# Patient Record
Sex: Female | Born: 1950 | Race: White | Hispanic: No | Marital: Married | Smoking: Former smoker
Health system: Southern US, Community
[De-identification: ages and names within clinical notes are randomized; demographics above are authoritative.]

## PROBLEM LIST (undated history)

## (undated) DIAGNOSIS — M1990 Unspecified osteoarthritis, unspecified site: Secondary | ICD-10-CM

## (undated) DIAGNOSIS — S43439A Superior glenoid labrum lesion of unspecified shoulder, initial encounter: Secondary | ICD-10-CM

## (undated) DIAGNOSIS — Z973 Presence of spectacles and contact lenses: Secondary | ICD-10-CM

## (undated) DIAGNOSIS — Z8709 Personal history of other diseases of the respiratory system: Secondary | ICD-10-CM

## (undated) DIAGNOSIS — J302 Other seasonal allergic rhinitis: Secondary | ICD-10-CM

## (undated) DIAGNOSIS — M75101 Unspecified rotator cuff tear or rupture of right shoulder, not specified as traumatic: Secondary | ICD-10-CM

## (undated) DIAGNOSIS — IMO0001 Reserved for inherently not codable concepts without codable children: Secondary | ICD-10-CM

## (undated) DIAGNOSIS — I10 Essential (primary) hypertension: Secondary | ICD-10-CM

## (undated) DIAGNOSIS — M419 Scoliosis, unspecified: Secondary | ICD-10-CM

## (undated) DIAGNOSIS — K635 Polyp of colon: Secondary | ICD-10-CM

## (undated) DIAGNOSIS — M75100 Unspecified rotator cuff tear or rupture of unspecified shoulder, not specified as traumatic: Secondary | ICD-10-CM

## (undated) DIAGNOSIS — Z8489 Family history of other specified conditions: Secondary | ICD-10-CM

## (undated) DIAGNOSIS — E785 Hyperlipidemia, unspecified: Secondary | ICD-10-CM

## (undated) DIAGNOSIS — M47816 Spondylosis without myelopathy or radiculopathy, lumbar region: Secondary | ICD-10-CM

## (undated) DIAGNOSIS — K219 Gastro-esophageal reflux disease without esophagitis: Secondary | ICD-10-CM

## (undated) HISTORY — PX: TUBAL LIGATION: SHX77

## (undated) HISTORY — PX: OTHER SURGICAL HISTORY: SHX169

## (undated) HISTORY — PX: COLONOSCOPY: SHX174

---

## 1986-07-15 HISTORY — PX: BACK SURGERY: SHX140

## 1998-02-11 ENCOUNTER — Ambulatory Visit (HOSPITAL_COMMUNITY): Admission: RE | Admit: 1998-02-11 | Discharge: 1998-02-11 | Payer: Self-pay | Admitting: *Deleted

## 1998-12-07 ENCOUNTER — Other Ambulatory Visit: Admission: RE | Admit: 1998-12-07 | Discharge: 1998-12-07 | Payer: Self-pay | Admitting: Gynecology

## 2000-01-01 ENCOUNTER — Other Ambulatory Visit: Admission: RE | Admit: 2000-01-01 | Discharge: 2000-01-01 | Payer: Self-pay | Admitting: Gynecology

## 2000-12-23 ENCOUNTER — Other Ambulatory Visit: Admission: RE | Admit: 2000-12-23 | Discharge: 2000-12-23 | Payer: Self-pay | Admitting: Gynecology

## 2002-01-05 ENCOUNTER — Other Ambulatory Visit: Admission: RE | Admit: 2002-01-05 | Discharge: 2002-01-05 | Payer: Self-pay | Admitting: Gynecology

## 2003-01-24 ENCOUNTER — Other Ambulatory Visit: Admission: RE | Admit: 2003-01-24 | Discharge: 2003-01-24 | Payer: Self-pay | Admitting: Gynecology

## 2004-03-29 ENCOUNTER — Other Ambulatory Visit: Admission: RE | Admit: 2004-03-29 | Discharge: 2004-03-29 | Payer: Self-pay | Admitting: Gynecology

## 2005-05-06 ENCOUNTER — Other Ambulatory Visit: Admission: RE | Admit: 2005-05-06 | Discharge: 2005-05-06 | Payer: Self-pay | Admitting: Gynecology

## 2005-12-12 ENCOUNTER — Ambulatory Visit (HOSPITAL_BASED_OUTPATIENT_CLINIC_OR_DEPARTMENT_OTHER): Admission: RE | Admit: 2005-12-12 | Discharge: 2005-12-12 | Payer: Self-pay | Admitting: Orthopedic Surgery

## 2005-12-12 HISTORY — PX: DORSAL COMPARTMENT RELEASE: SHX1474

## 2008-12-26 ENCOUNTER — Encounter: Admission: RE | Admit: 2008-12-26 | Discharge: 2008-12-26 | Payer: Self-pay | Admitting: Family Medicine

## 2010-02-23 ENCOUNTER — Ambulatory Visit (HOSPITAL_COMMUNITY): Admission: RE | Admit: 2010-02-23 | Discharge: 2010-02-23 | Payer: Self-pay | Admitting: Gynecology

## 2010-11-30 NOTE — Op Note (Signed)
NAMEBLAYKE, PINERA               ACCOUNT NO.:  [id number]   MEDICAL RECORD NO.:  [id number]          PATIENT TYPE:  AMB   LOCATION:  DSC                          FACILITY:  [facility]   PHYSICIAN:  Katy Fitch. Sypher, M.D. DATE OF BIRTH:  January 25, 1951   DATE OF PROCEDURE:  12/12/2005  DATE OF DISCHARGE:                                 OPERATIVE REPORT   PREOPERATIVE DIAGNOSIS:  Chronic stenosing tenosynovitis, right first dorsal  compartment.   POSTOPERATIVE DIAGNOSIS:  Chronic stenosing tenosynovitis, right first  dorsal compartment.   OPERATIONS:  Release of right first dorsal compartment.   OPERATING SURGEON:  Josephine Igo, M.D.   ASSISTANT:  Molly Maduro Dasnoit PA-C.   ANESTHESIA:  General by LMA technique.   SUPERVISING ANESTHESIOLOGIST:  Dr. Michelle Piper.   INDICATIONS:  Zadie Deemer is a 60 year old woman referred through the  courtesy of Dr. Catha Gosselin for evaluation and management of a chronically  painful right wrist.  Clinical examination revealed signs of chronic  stenosing tenosynovitis of the right first dorsal compartment.  An x-ray  from Dr. Fredirick Maudlin office was normal.   Due to failure to respond to nonoperative measures Ms. Schnyder is brought to  the operating room at this time for release of her right first dorsal  compartment.   PROCEDURE:  Lashon Beringer was brought to the operating room and placed in  supine position on the operating table.   Following the induction general anesthesia by LMA technique, the right arm  was prepped with Betadine soap solution, sterilely draped.   On exsanguination of right arm with an Esmarch bandage, the arterial  tourniquet was inflated to 220 mmHg.  The procedure commenced with short  transverse incision directly over the palpably thickened first dorsal  compartment.  The subcutaneous tissues were gently divided, retracting the  radial sensory branches.  A Freer used to clear all inflammatory tissues  from the compartment followed  by use of a scalpel and scissors to release  the compartment.  The wall thickness of the compartment had increased to 4  mm.  There were three tendons within the compartment.  No septum was  visualized.  There was a single slip of the extensor pollicis brevis and two  slips of the abductor pollicis longus.   Free range of motion recovered followed by repair of the skin with  intradermal 3-0 Prolene suture and Steri-Strip.   2% lidocaine was infiltrated for postoperative analgesia followed by release  of the tourniquet.  Ms. Oravec was placed in a compressive Ace wrap  dressing.  There no apparent complications.      Katy Fitch Sypher, M.D.  Electronically Signed     RVS/MEDQ  D:  12/12/2005  T:  12/12/2005  Job:  [id number]   cc:   Caryn Bee L. Little, M.D.  Fax: [phone]

## 2011-07-02 ENCOUNTER — Other Ambulatory Visit: Payer: Self-pay | Admitting: Orthopedic Surgery

## 2011-07-18 ENCOUNTER — Encounter (HOSPITAL_BASED_OUTPATIENT_CLINIC_OR_DEPARTMENT_OTHER): Payer: Self-pay | Admitting: *Deleted

## 2011-07-18 ENCOUNTER — Other Ambulatory Visit: Payer: Self-pay | Admitting: Orthopedic Surgery

## 2011-07-18 NOTE — Progress Notes (Signed)
No labs needed Has been here in past

## 2011-07-23 ENCOUNTER — Ambulatory Visit (HOSPITAL_BASED_OUTPATIENT_CLINIC_OR_DEPARTMENT_OTHER)
Admission: RE | Admit: 2011-07-23 | Discharge: 2011-07-23 | Disposition: A | Discharge status: Home Health | Payer: BC Managed Care – PPO | Source: Ambulatory Visit | Attending: Orthopedic Surgery | Admitting: Orthopedic Surgery

## 2011-07-23 ENCOUNTER — Encounter (HOSPITAL_BASED_OUTPATIENT_CLINIC_OR_DEPARTMENT_OTHER): Payer: Self-pay | Admitting: Anesthesiology

## 2011-07-23 ENCOUNTER — Encounter (HOSPITAL_BASED_OUTPATIENT_CLINIC_OR_DEPARTMENT_OTHER)
Admission: RE | Disposition: A | Discharge status: Home Health | Payer: Self-pay | Source: Ambulatory Visit | Attending: Orthopedic Surgery

## 2011-07-23 ENCOUNTER — Encounter (HOSPITAL_BASED_OUTPATIENT_CLINIC_OR_DEPARTMENT_OTHER): Payer: Self-pay | Admitting: Orthopedic Surgery

## 2011-07-23 ENCOUNTER — Ambulatory Visit (HOSPITAL_BASED_OUTPATIENT_CLINIC_OR_DEPARTMENT_OTHER): Payer: BC Managed Care – PPO | Admitting: Anesthesiology

## 2011-07-23 ENCOUNTER — Encounter (HOSPITAL_BASED_OUTPATIENT_CLINIC_OR_DEPARTMENT_OTHER): Payer: Self-pay

## 2011-07-23 DIAGNOSIS — M65849 Other synovitis and tenosynovitis, unspecified hand: Secondary | ICD-10-CM | POA: Insufficient documentation

## 2011-07-23 DIAGNOSIS — M65839 Other synovitis and tenosynovitis, unspecified forearm: Secondary | ICD-10-CM | POA: Insufficient documentation

## 2011-07-23 HISTORY — DX: Other seasonal allergic rhinitis: J30.2

## 2011-07-23 HISTORY — DX: Unspecified osteoarthritis, unspecified site: M19.90

## 2011-07-23 HISTORY — PX: DORSAL COMPARTMENT RELEASE: SHX5039

## 2011-07-23 HISTORY — PX: TRIGGER FINGER RELEASE: SHX641

## 2011-07-23 SURGERY — RELEASE, A1 PULLEY, FOR TRIGGER FINGER
Anesthesia: Choice | Site: Wrist | Laterality: Left | Wound class: Clean

## 2011-07-23 MED ORDER — MIDAZOLAM HCL 2 MG/2ML IJ SOLN: 0.5000 mg | INTRAMUSCULAR | Status: DC | PRN

## 2011-07-23 MED ORDER — ONDANSETRON HCL 4 MG/2ML IJ SOLN
INTRAMUSCULAR | Status: DC | PRN
  Administered 2011-07-23: 4 mg via INTRAVENOUS

## 2011-07-23 MED ORDER — FENTANYL CITRATE 0.05 MG/ML IJ SOLN: 50.0000 ug | INTRAMUSCULAR | Status: DC | PRN

## 2011-07-23 MED ORDER — LIDOCAINE HCL 2 % IJ SOLN
INTRAMUSCULAR | Status: DC | PRN
  Administered 2011-07-23: 3 mL

## 2011-07-23 MED ORDER — METOCLOPRAMIDE HCL 5 MG/ML IJ SOLN: 10.0000 mg | Freq: Once | INTRAMUSCULAR | Status: DC | PRN

## 2011-07-23 MED ORDER — FENTANYL CITRATE 0.05 MG/ML IJ SOLN
INTRAMUSCULAR | Status: DC | PRN
  Administered 2011-07-23: 100 ug via INTRAVENOUS

## 2011-07-23 MED ORDER — LACTATED RINGERS IV SOLN
INTRAVENOUS | Status: DC
  Administered 2011-07-23: 08:00:00 via INTRAVENOUS

## 2011-07-23 MED ORDER — CHLORHEXIDINE GLUCONATE 4 % EX LIQD: 60.0000 mL | Freq: Once | CUTANEOUS | Status: DC

## 2011-07-23 MED ORDER — FENTANYL CITRATE 0.05 MG/ML IJ SOLN
25.0000 ug | INTRAMUSCULAR | Status: DC | PRN
  Administered 2011-07-23 (×2): 50 ug via INTRAVENOUS

## 2011-07-23 MED ORDER — PROPOFOL 10 MG/ML IV EMUL
INTRAVENOUS | Status: DC | PRN
  Administered 2011-07-23: 40 mg via INTRAVENOUS

## 2011-07-23 MED ORDER — MORPHINE SULFATE 2 MG/ML IJ SOLN: 0.0500 mg/kg | INTRAMUSCULAR | Status: DC | PRN

## 2011-07-23 MED ORDER — LIDOCAINE HCL (CARDIAC) 20 MG/ML IV SOLN
INTRAVENOUS | Status: DC | PRN
  Administered 2011-07-23: 60 mg via INTRAVENOUS

## 2011-07-23 MED ORDER — PROPOFOL 10 MG/ML IV EMUL
INTRAVENOUS | Status: DC | PRN
  Administered 2011-07-23: 40 ug/kg/min via INTRAVENOUS

## 2011-07-23 MED ORDER — MIDAZOLAM HCL 5 MG/5ML IJ SOLN
INTRAMUSCULAR | Status: DC | PRN
  Administered 2011-07-23: 2 mg via INTRAVENOUS

## 2011-07-23 MED ORDER — TRAMADOL HCL 50 MG PO TABS: ORAL_TABLET | ORAL | Status: AC

## 2011-07-23 SURGICAL SUPPLY — 46 items
BANDAGE ELASTIC 3 VELCRO ST LF (GAUZE/BANDAGES/DRESSINGS) ×3 IMPLANT
BLADE MINI RND TIP GREEN BEAV (BLADE) IMPLANT
BLADE SURG 15 STRL LF DISP TIS (BLADE) ×2 IMPLANT
BLADE SURG 15 STRL SS (BLADE) ×3
BNDG CMPR 9X4 STRL LF SNTH (GAUZE/BANDAGES/DRESSINGS) ×2
BNDG CMPR MD 5X2 ELC HKLP STRL (GAUZE/BANDAGES/DRESSINGS) ×2
BNDG ELASTIC 2 VLCR STRL LF (GAUZE/BANDAGES/DRESSINGS) ×3 IMPLANT
BNDG ESMARK 4X9 LF (GAUZE/BANDAGES/DRESSINGS) ×1 IMPLANT
BRUSH SCRUB EZ PLAIN DRY (MISCELLANEOUS) ×3 IMPLANT
CLOTH BEACON ORANGE TIMEOUT ST (SAFETY) ×3 IMPLANT
CORDS BIPOLAR (ELECTRODE) ×3 IMPLANT
COVER MAYO STAND STRL (DRAPES) ×3 IMPLANT
COVER TABLE BACK 60X90 (DRAPES) ×3 IMPLANT
CUFF TOURNIQUET SINGLE 18IN (TOURNIQUET CUFF) ×3 IMPLANT
DECANTER SPIKE VIAL GLASS SM (MISCELLANEOUS) IMPLANT
DRAPE EXTREMITY T 121X128X90 (DRAPE) ×3 IMPLANT
DRAPE SURG 17X23 STRL (DRAPES) ×3 IMPLANT
DRSG TEGADERM 4X4.75 (GAUZE/BANDAGES/DRESSINGS) IMPLANT
GAUZE SPONGE 4X4 12PLY STRL LF (GAUZE/BANDAGES/DRESSINGS) ×4 IMPLANT
GAUZE XEROFORM 1X8 LF (GAUZE/BANDAGES/DRESSINGS) ×3 IMPLANT
GLOVE BIOGEL M STRL SZ7.5 (GLOVE) ×3 IMPLANT
GLOVE ORTHO TXT STRL SZ7.5 (GLOVE) ×3 IMPLANT
GOWN PREVENTION PLUS XLARGE (GOWN DISPOSABLE) ×3 IMPLANT
GOWN PREVENTION PLUS XXLARGE (GOWN DISPOSABLE) ×4 IMPLANT
GOWN STRL REIN XL XLG (GOWN DISPOSABLE) ×6 IMPLANT
NEEDLE 27GAX1X1/2 (NEEDLE) ×2 IMPLANT
PACK BASIN DAY SURGERY FS (CUSTOM PROCEDURE TRAY) ×3 IMPLANT
PAD CAST 3X4 CTTN HI CHSV (CAST SUPPLIES) IMPLANT
PAD CAST 4YDX4 CTTN HI CHSV (CAST SUPPLIES) ×2 IMPLANT
PADDING CAST ABS 4INX4YD NS (CAST SUPPLIES) ×1
PADDING CAST ABS COTTON 4X4 ST (CAST SUPPLIES) ×2 IMPLANT
PADDING CAST COTTON 3X4 STRL (CAST SUPPLIES)
PADDING CAST COTTON 4X4 STRL (CAST SUPPLIES) ×3
SLEEVE SCD COMPRESS KNEE MED (MISCELLANEOUS) IMPLANT
SPONGE GAUZE 4X4 12PLY (GAUZE/BANDAGES/DRESSINGS) ×1 IMPLANT
STOCKINETTE 4X48 STRL (DRAPES) ×3 IMPLANT
STRIP CLOSURE SKIN 1/2X4 (GAUZE/BANDAGES/DRESSINGS) ×3 IMPLANT
SUT PROLENE 3 0 PS 2 (SUTURE) ×3 IMPLANT
SUT VIC AB 4-0 P-3 18XBRD (SUTURE) IMPLANT
SUT VIC AB 4-0 P3 18 (SUTURE)
SYR 3ML 23GX1 SAFETY (SYRINGE) IMPLANT
SYR CONTROL 10ML LL (SYRINGE) ×1 IMPLANT
TOWEL OR 17X24 6PK STRL BLUE (TOWEL DISPOSABLE) ×3 IMPLANT
TRAY DSU PREP LF (CUSTOM PROCEDURE TRAY) ×3 IMPLANT
UNDERPAD 30X30 INCONTINENT (UNDERPADS AND DIAPERS) ×3 IMPLANT
WATER STERILE IRR 1000ML POUR (IV SOLUTION) ×3 IMPLANT

## 2011-07-23 NOTE — Anesthesia Postprocedure Evaluation (Signed)
Anesthesia Post Note  Patient: Jean Hudson  Procedure(s) Performed:  RELEASE TRIGGER FINGER/A-1 PULLEY - left thumb; RELEASE DORSAL COMPARTMENT (DEQUERVAIN) - 1st left dorsal compartment  Anesthesia type: General  Patient location: PACU  Post pain: Pain level controlled  Post assessment: Patient's Cardiovascular Status Stable  Last Vitals:  Filed Vitals:   07/23/11 1008  BP: 143/74  Pulse: 61  Temp: 36.4 C  Resp: 16    Post vital signs: Reviewed and stable  Level of consciousness: alert  Complications: No apparent anesthesia complications

## 2011-07-23 NOTE — Brief Op Note (Signed)
07/23/2011  9:11 AM  PATIENT:  Jean Hudson  61 y.o. female  PRE-OPERATIVE DIAGNOSIS:  left 1st dorsal sts, left sts thumb  POST-OPERATIVE DIAGNOSIS:  left 1st dorsal sts, left sts thumb  PROCEDURE:  Procedure(s): RELEASE TRIGGER THUMB /A-1 PULLEY LEFT RELEASE DORSAL COMPARTMENT (DEQUERVAIN) LEFT WRIST  SURGEON:  Surgeon(s): Wyn Forster., MD  PHYSICIAN ASSISTANT:   ASSISTANTS: Mallory Shirk.A-C   -History and Physical Reviewed  -Patient has been re-examined  -No change in the plan of care  Wyn Forster, MD      ANESTHESIA:   local  EBL:  Total I/O In: 300 [I.V.:300] Out: -   BLOOD ADMINISTERED:none  DRAINS: none   LOCAL MEDICATIONS USED:  LIDOCAINE 3 CC 2%  SPECIMEN:  No Specimen  DISPOSITION OF SPECIMEN:  N/A  COUNTS:  YES  TOURNIQUET:  * Missing tourniquet times found for documented tourniquets in log:  15058 *  DICTATION: .Other Dictation: Dictation Number [id number]  PLAN OF CARE: Discharge to home after PACU  PATIENT DISPOSITION:  PACU - hemodynamically stable.

## 2011-07-23 NOTE — Anesthesia Preprocedure Evaluation (Signed)
Anesthesia Evaluation  Patient identified by MRN, date of birth, ID band Patient awake    Reviewed: Allergy & Precautions, H&P , NPO status , Patient's Chart, lab work & pertinent test results, reviewed documented beta blocker date and time   Airway Mallampati: II TM Distance: >3 FB Neck ROM: full    Dental   Pulmonary asthma ,          Cardiovascular neg cardio ROS     Neuro/Psych Negative Neurological ROS  Negative Psych ROS   GI/Hepatic negative GI ROS, Neg liver ROS,   Endo/Other  Negative Endocrine ROS  Renal/GU negative Renal ROS  Genitourinary negative   Musculoskeletal   Abdominal   Peds  Hematology negative hematology ROS (+)   Anesthesia Other Findings See surgeon's H&P   Reproductive/Obstetrics negative OB ROS                           Anesthesia Physical Anesthesia Plan  ASA: II  Anesthesia Plan: MAC   Post-op Pain Management:    Induction:   Airway Management Planned: Simple Face Mask  Additional Equipment:   Intra-op Plan:   Post-operative Plan:   Informed Consent: I have reviewed the patients History and Physical, chart, labs and discussed the procedure including the risks, benefits and alternatives for the proposed anesthesia with the patient or authorized representative who has indicated his/her understanding and acceptance.     Plan Discussed with: CRNA and Surgeon  Anesthesia Plan Comments:         Anesthesia Quick Evaluation

## 2011-07-23 NOTE — Anesthesia Procedure Notes (Signed)
Procedure Name: MAC Performed by: Sharyne Richters Pre-anesthesia Checklist: Patient identified, Suction available and Patient being monitored Oxygen Delivery Method: Simple face mask Placement Confirmation: positive ETCO2

## 2011-07-23 NOTE — Op Note (Signed)
NAMECERITA, RABELO NO.:  [id number]  MEDICAL RECORD NO.:  [id number]  LOCATION:                                 FACILITY:  PHYSICIAN:  Katy Fitch. Jovanny Stephanie, M.D.      DATE OF BIRTH:  DATE OF PROCEDURE:  07/23/2011 DATE OF DISCHARGE:                              OPERATIVE REPORT   PREOPERATIVE DIAGNOSES: 1. Chronic severe stenosing tenosynovitis of left first dorsal     compartment with marked synovitis. 2. 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  POSTOPERATIVE DIAGNOSES: 1. Chronic severe stenosing tenosynovitis of left first dorsal     compartment with marked synovitis. 2. 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 3. Identification of marked synovitis in proximal and within first     dorsal compartment of left wrist.  OPERATION: 1. Release of left first dorsal compartment with incidental     synovectomy and release of septum between abductor pollicis longus     and extensor pollicis brevis tendons distally. 2. Release of A1 pulley of left thumb.  SURGEON:  Katy Fitch. Andrew Blasius, M.D.  ASSISTANT:  Marveen Reeks. Dasnoit, PA-C.  ANESTHESIA:  Lidocaine 2%, field block of left thumb with flexor pollicis longus sheath block and first dorsal compartment block; total volume of 2% lidocaine 3 mL.  SUPERVISED ANESTHESIOLOGIST:  Janetta Hora. Gelene Mink, M.D.  INDICATIONS:  Damaris Abeln is a 61 year old woman referred through the courtesy of Dr. Catha Gosselin for evaluation and management of bilateral trigger thumbs and a painful left first dorsal compartment.  We have tried conservative management with splinting, activity modification, anti-inflammatory medication for several weeks without relief.  She had a large bump at her first dorsal compartment and evidence of chronic synovitis proximal to the compartment.  She had a persistent locking of her left thumb despite steroid injection and other non operative means of  management.  Due to a failure respond to nonoperative measures, she is brought to the operating room at this time for release of her left thumb A1 pulley and release of her left wrist first dorsal compartment.  Preoperatively, she was reminded of potential risks and benefits of surgery.  Questions invited and answered in detail.  DESCRIPTION OF PROCEDURE:  Maudry Zeidan was brought to room 2 of the [facility] and placed in supine position on the operating table.  Following sedation, the left hand and wrist were prepped with Betadine followed by infiltration of 2% lidocaine into the path of intended incision at the first dorsal compartment as well as infiltration into the compartment and infiltration into the flexor pollicis longus tendon sheath and around the anticipated area of incision at the base of the thumb.  The left arm was then prepped with Betadine soap and solution, sterilely draped.  A pneumatic tourniquet was applied to proximal left brachium. Following exsanguination of the left arm with Esmarch bandage, arterial tourniquet was inflated to 220 mmHg.  A routine surgical time-out was accomplished.  Procedure commenced with a short transverse incision directly over the apex of the thickened first dorsal compartment.  Subcutaneous tissues were carefully divided, revealing a very swollen compartment that was invested with inflammatory synovium.  The wall thickness of the compartment was increased to more than 2.5 mm. The radial superficial sensory branches were identified and gently retracted.  The compartment then split longitudinally, revealing three tendon slips, two of the abductor pollicis longus and one of the extensor pollicis brevis.  These were followed distally identifying a small septum that was resected distally.  There was abundant tenosynovitis noted proximally.  Once again assuring that the sensory branches were gently retracted and a synovectomy  was accomplished with a rongeur.  This wound was then repaired with intradermal 3-0 Prolene.  Attention was then directed to the thumb.  A short transverse incision was fashioned directly over the A1 pulley.  Subcutaneous tissues were carefully divided, revealing A1 pulley.  The neurovascular structures were retracted.  The pulley was split with scalpel and scissors.  The tendon was delivered and found to have a large nodule of inflammatory tenosynovitis within the tendon.  This should resolve over time after release of the A1 pulley.  This wound was then repaired with intradermal 3-0 Prolene and a Steri-Strip.  For aftercare, Ms. Leider was placed in compressive dressing with an Ace wrap.  She is provided a prescription for tramadol 50 mg 1 p.o. q.4- 6 hours p.r.n. pain, 20 tablets, without refill.     Katy Fitch Pricila Bridge, M.D.     RVS/MEDQ  D:  07/23/2011  T:  07/23/2011  Job:  [id number]  cc:   Caryn Bee L. Little, M.D.

## 2011-07-23 NOTE — Transfer of Care (Signed)
Immediate Anesthesia Transfer of Care Note  Patient: Jean Hudson  Procedure(s) Performed:  RELEASE TRIGGER FINGER/A-1 PULLEY - left thumb; RELEASE DORSAL COMPARTMENT (DEQUERVAIN) - 1st left dorsal compartment  Patient Location: PACU  Anesthesia Type: MAC  Level of Consciousness: awake  Airway & Oxygen Therapy: Patient Spontanous Breathing  Post-op Assessment: Report given to PACU RN and Post -op Vital signs reviewed and stable  Post vital signs: Reviewed and stable  Complications: No apparent anesthesia complications

## 2011-07-23 NOTE — H&P (Signed)
Jean Hudson is an 61 y.o. female.   Chief Complaint: Complaining of a long history of left first dorsal compartment pain and triggering of the left thumb HPI: Patient is a 61 year old right-hand-dominant female who presented to our office recently complaining of a long term history of pain in the area of the left first dorsal compartment and also symptoms of triggering of the left thumb. No history of antecedent trauma. She tried conservative treatment for this without success. She wishes to proceed with surgical intervention.  Past Medical History  Diagnosis Date  . Asthma   . Seasonal allergies   . Arthritis     Past Surgical History  Procedure Date  . Back surgery 1988    lumb lam  . Colonoscopy     x2  . Dorsal compartment release 2007    rt    History reviewed. No pertinent family history. Social History:  reports that she has never smoked. She does not have any smokeless tobacco history on file. She reports that she drinks alcohol. She reports that she does not use illicit drugs.  Allergies:  Allergies  Allergen Reactions  . Demerol Nausea And Vomiting  . Flagyl (Metronidazole Hcl)     Joint pain    No current facility-administered medications on file as of 07/23/2011.   Medications Prior to Admission  Medication Sig Dispense Refill  . fexofenadine-pseudoephedrine (ALLEGRA-D 24) 180-240 MG per 24 hr tablet Take 1 tablet by mouth daily.        . fluticasone-salmeterol (ADVAIR HFA) 230-21 MCG/ACT inhaler Inhale 2 puffs into the lungs 2 (two) times daily.        Marland Kitchen ibuprofen (ADVIL,MOTRIN) 200 MG tablet Take 200 mg by mouth every 6 (six) hours as needed.        . montelukast (SINGULAIR) 10 MG tablet Take 10 mg by mouth at bedtime.        . Multiple Vitamin (MULTIVITAMIN) capsule Take 1 capsule by mouth daily.        . naproxen sodium (ANAPROX) 220 MG tablet Take 220 mg by mouth 2 (two) times daily as needed.          No results found for this or any previous visit  (from the past 48 hour(s)).  No results found.   Pertinent items are noted in HPI.  Height 5\' 4"  (1.626 m), weight 66.679 kg (147 lb).  General appearance: alert Head: Normocephalic, without obvious abnormality Neck: supple, symmetrical, trachea midline Resp: clear to auscultation bilaterally Cardio: regular rate and rhythm, S1, S2 normal, no murmur, click, rub or gallop GI: normal findings: bowel sounds normal Extremities: Examination of the left hand reveals active triggering of the left thumb at the A1 pulley. She has a positive Finkelstein's on the left. Neurovascular exam was intact. She has full range of motion of her other digits. Pulses: 2+ and symmetric Skin: normal Neurologic: Grossly normal    Assessment/Plan Impression: De Quervain's tenosynovitis of the left first dorsal compartment. Stenosing tenosynovitis of the left thumb.  Plan: Patient to be taken to the operating room to undergo release of left first dorsal compartment and release of A1 pulley of the left thumb. The procedure risks benefits and postoperative course were discussed with the patient at length she was in agreement with this plan.  DASNOIT,Jean Hudson 07/23/2011, 7:29 AM   H&P documentation: 07/23/2011  -History and Physical Reviewed  -Patient has been re-examined  -No change in the plan of care  Wyn Forster, MD

## 2011-07-23 NOTE — Op Note (Signed)
Op note dictated 07/23/11 161096

## 2011-07-24 ENCOUNTER — Encounter (HOSPITAL_BASED_OUTPATIENT_CLINIC_OR_DEPARTMENT_OTHER): Payer: Self-pay | Admitting: Orthopedic Surgery

## 2011-08-16 DIAGNOSIS — S43439A Superior glenoid labrum lesion of unspecified shoulder, initial encounter: Secondary | ICD-10-CM

## 2011-08-16 DIAGNOSIS — M75101 Unspecified rotator cuff tear or rupture of right shoulder, not specified as traumatic: Secondary | ICD-10-CM

## 2011-08-16 HISTORY — DX: Superior glenoid labrum lesion of unspecified shoulder, initial encounter: S43.439A

## 2011-08-16 HISTORY — PX: SHOULDER ARTHROSCOPY: SHX128

## 2011-08-16 HISTORY — DX: Unspecified rotator cuff tear or rupture of right shoulder, not specified as traumatic: M75.101

## 2011-08-28 ENCOUNTER — Other Ambulatory Visit: Payer: Self-pay | Admitting: Orthopedic Surgery

## 2011-09-02 ENCOUNTER — Encounter (HOSPITAL_BASED_OUTPATIENT_CLINIC_OR_DEPARTMENT_OTHER): Payer: Self-pay | Admitting: *Deleted

## 2011-09-05 ENCOUNTER — Encounter (HOSPITAL_BASED_OUTPATIENT_CLINIC_OR_DEPARTMENT_OTHER): Payer: Self-pay | Admitting: Orthopedic Surgery

## 2011-09-05 ENCOUNTER — Ambulatory Visit (HOSPITAL_BASED_OUTPATIENT_CLINIC_OR_DEPARTMENT_OTHER): Payer: BC Managed Care – PPO | Admitting: Anesthesiology

## 2011-09-05 ENCOUNTER — Encounter (HOSPITAL_BASED_OUTPATIENT_CLINIC_OR_DEPARTMENT_OTHER): Payer: Self-pay | Admitting: Anesthesiology

## 2011-09-05 ENCOUNTER — Encounter (HOSPITAL_BASED_OUTPATIENT_CLINIC_OR_DEPARTMENT_OTHER)
Admission: RE | Disposition: A | Discharge status: Home / Self Care | Payer: Self-pay | Source: Ambulatory Visit | Attending: Orthopedic Surgery

## 2011-09-05 ENCOUNTER — Ambulatory Visit (HOSPITAL_BASED_OUTPATIENT_CLINIC_OR_DEPARTMENT_OTHER)
Admission: RE | Admit: 2011-09-05 | Discharge: 2011-09-05 | Disposition: A | Discharge status: Home / Self Care | Payer: BC Managed Care – PPO | Source: Ambulatory Visit | Attending: Orthopedic Surgery | Admitting: Orthopedic Surgery

## 2011-09-05 ENCOUNTER — Encounter (HOSPITAL_BASED_OUTPATIENT_CLINIC_OR_DEPARTMENT_OTHER): Payer: Self-pay | Admitting: *Deleted

## 2011-09-05 DIAGNOSIS — M19019 Primary osteoarthritis, unspecified shoulder: Secondary | ICD-10-CM | POA: Insufficient documentation

## 2011-09-05 DIAGNOSIS — M7500 Adhesive capsulitis of unspecified shoulder: Secondary | ICD-10-CM | POA: Insufficient documentation

## 2011-09-05 DIAGNOSIS — J449 Chronic obstructive pulmonary disease, unspecified: Secondary | ICD-10-CM | POA: Insufficient documentation

## 2011-09-05 DIAGNOSIS — J4489 Other specified chronic obstructive pulmonary disease: Secondary | ICD-10-CM | POA: Insufficient documentation

## 2011-09-05 DIAGNOSIS — Z5333 Arthroscopic surgical procedure converted to open procedure: Secondary | ICD-10-CM | POA: Insufficient documentation

## 2011-09-05 DIAGNOSIS — Z87891 Personal history of nicotine dependence: Secondary | ICD-10-CM | POA: Insufficient documentation

## 2011-09-05 DIAGNOSIS — M24119 Other articular cartilage disorders, unspecified shoulder: Secondary | ICD-10-CM | POA: Insufficient documentation

## 2011-09-05 DIAGNOSIS — M719 Bursopathy, unspecified: Secondary | ICD-10-CM | POA: Insufficient documentation

## 2011-09-05 DIAGNOSIS — M67919 Unspecified disorder of synovium and tendon, unspecified shoulder: Secondary | ICD-10-CM | POA: Insufficient documentation

## 2011-09-05 HISTORY — DX: Unspecified rotator cuff tear or rupture of right shoulder, not specified as traumatic: M75.101

## 2011-09-05 HISTORY — DX: Superior glenoid labrum lesion of unspecified shoulder, initial encounter: S43.439A

## 2011-09-05 LAB — POCT HEMOGLOBIN-HEMACUE: Hemoglobin: 14.3 g/dL (ref 12.0–15.0)

## 2011-09-05 SURGERY — ARTHROSCOPY, SHOULDER WITH REPAIR, ROTATOR CUFF, OPEN
Anesthesia: General | Site: Shoulder | Laterality: Right | Wound class: Clean

## 2011-09-05 MED ORDER — PROPOFOL 10 MG/ML IV EMUL
INTRAVENOUS | Status: DC | PRN
  Administered 2011-09-05: 30 mg via INTRAVENOUS
  Administered 2011-09-05: 200 mg via INTRAVENOUS

## 2011-09-05 MED ORDER — SODIUM CHLORIDE 0.9 % IR SOLN
Status: DC | PRN
  Administered 2011-09-05: 9000 mL

## 2011-09-05 MED ORDER — DROPERIDOL 2.5 MG/ML IJ SOLN
INTRAMUSCULAR | Status: DC | PRN
  Administered 2011-09-05: 0.625 mg via INTRAVENOUS

## 2011-09-05 MED ORDER — SUCCINYLCHOLINE CHLORIDE 20 MG/ML IJ SOLN
INTRAMUSCULAR | Status: DC | PRN
  Administered 2011-09-05: 120 mg via INTRAVENOUS

## 2011-09-05 MED ORDER — EPHEDRINE SULFATE 50 MG/ML IJ SOLN
INTRAMUSCULAR | Status: DC | PRN
  Administered 2011-09-05 (×2): 5 mg via INTRAVENOUS

## 2011-09-05 MED ORDER — BUPIVACAINE-EPINEPHRINE 0.25% -1:200000 IJ SOLN
INTRAMUSCULAR | Status: DC | PRN
  Administered 2011-09-05: 10 mL

## 2011-09-05 MED ORDER — MIDAZOLAM HCL 2 MG/2ML IJ SOLN
1.0000 mg | INTRAMUSCULAR | Status: DC | PRN
  Administered 2011-09-05: 1 mg via INTRAVENOUS

## 2011-09-05 MED ORDER — HYDROMORPHONE HCL 2 MG PO TABS: ORAL_TABLET | ORAL | Status: AC

## 2011-09-05 MED ORDER — DEXAMETHASONE SODIUM PHOSPHATE 4 MG/ML IJ SOLN
INTRAMUSCULAR | Status: DC | PRN
  Administered 2011-09-05: 4 mg via INTRAVENOUS

## 2011-09-05 MED ORDER — CEFAZOLIN SODIUM 1-5 GM-% IV SOLN
INTRAVENOUS | Status: DC | PRN
  Administered 2011-09-05: 1 g via INTRAVENOUS

## 2011-09-05 MED ORDER — FENTANYL CITRATE 0.05 MG/ML IJ SOLN
50.0000 ug | INTRAMUSCULAR | Status: DC | PRN
  Administered 2011-09-05: 50 ug via INTRAVENOUS

## 2011-09-05 MED ORDER — ROPIVACAINE HCL 5 MG/ML IJ SOLN
INTRAMUSCULAR | Status: DC | PRN
  Administered 2011-09-05: 30 mg via EPIDURAL

## 2011-09-05 MED ORDER — CEPHALEXIN 500 MG PO CAPS: 500.0000 mg | ORAL_CAPSULE | Freq: Three times a day (TID) | ORAL | Status: AC

## 2011-09-05 MED ORDER — ONDANSETRON HCL 4 MG/2ML IJ SOLN
INTRAMUSCULAR | Status: DC | PRN
  Administered 2011-09-05: 4 mg via INTRAVENOUS

## 2011-09-05 MED ORDER — CHLORHEXIDINE GLUCONATE 4 % EX LIQD: 60.0000 mL | Freq: Once | CUTANEOUS | Status: DC

## 2011-09-05 MED ORDER — LACTATED RINGERS IV SOLN
INTRAVENOUS | Status: DC
  Administered 2011-09-05 (×2): via INTRAVENOUS

## 2011-09-05 SURGICAL SUPPLY — 85 items
ANCH SUT 2 FT CRKSW 14.7X5.5 (Anchor) ×4 IMPLANT
ANCH SUT SWLK 19.1X5.5 CLS EL (Anchor) ×4 IMPLANT
ANCHOR CORKSCREW FIBER 5.5X15 (Anchor) ×4 IMPLANT
ANCHOR PEEK SWIVEL LOCK 5.5 (Anchor) ×4 IMPLANT
BANDAGE ADHESIVE 1X3 (GAUZE/BANDAGES/DRESSINGS) IMPLANT
BLADE AVERAGE 25X9 (BLADE) IMPLANT
BLADE CUTTER MENIS 5.5 (BLADE) IMPLANT
BLADE SURG 15 STRL LF DISP TIS (BLADE) ×4 IMPLANT
BLADE SURG 15 STRL SS (BLADE) ×6
BUR EGG 3PK/BX (BURR) IMPLANT
BUR OVAL 6.0 (BURR) ×3 IMPLANT
CANISTER OMNI JUG 16 LITER (MISCELLANEOUS) ×3 IMPLANT
CANISTER SUCTION 2500CC (MISCELLANEOUS) ×2 IMPLANT
CANNULA 5.75X7 CRYSTAL CLEAR (CANNULA) IMPLANT
CANNULA SHOULDER 7CM (CANNULA) IMPLANT
CANNULA TWIST IN 8.25X7CM (CANNULA) IMPLANT
CLEANER CAUTERY TIP 5X5 PAD (MISCELLANEOUS) IMPLANT
CLOTH BEACON ORANGE TIMEOUT ST (SAFETY) ×3 IMPLANT
CUTTER MENISCUS  4.2MM (BLADE) ×1
CUTTER MENISCUS 4.2MM (BLADE) ×2 IMPLANT
DECANTER SPIKE VIAL GLASS SM (MISCELLANEOUS) IMPLANT
DRAPE INCISE IOBAN 66X45 STRL (DRAPES) ×3 IMPLANT
DRAPE STERI 35X30 U-POUCH (DRAPES) ×3 IMPLANT
DRAPE SURG 17X23 STRL (DRAPES) ×3 IMPLANT
DRAPE U-SHAPE 47X51 STRL (DRAPES) ×3 IMPLANT
DRAPE U-SHAPE 76X120 STRL (DRAPES) ×6 IMPLANT
DRAPE UTILITY W/TAPE 26X15 (DRAPES) ×3 IMPLANT
DRSG PAD ABDOMINAL 8X10 ST (GAUZE/BANDAGES/DRESSINGS) ×3 IMPLANT
DURAPREP 26ML APPLICATOR (WOUND CARE) ×3 IMPLANT
ELECT REM PT RETURN 9FT ADLT (ELECTROSURGICAL) ×3
ELECTRODE REM PT RTRN 9FT ADLT (ELECTROSURGICAL) ×2 IMPLANT
GLOVE BIO SURGEON STRL SZ 6.5 (GLOVE) ×2 IMPLANT
GLOVE BIOGEL M STRL SZ7.5 (GLOVE) ×3 IMPLANT
GLOVE BIOGEL PI IND STRL 7.0 (GLOVE) ×2 IMPLANT
GLOVE BIOGEL PI IND STRL 8 (GLOVE) ×4 IMPLANT
GLOVE BIOGEL PI INDICATOR 7.0 (GLOVE) ×1
GLOVE BIOGEL PI INDICATOR 8 (GLOVE) ×2
GLOVE ORTHO TXT STRL SZ7.5 (GLOVE) ×3 IMPLANT
GOWN BRE IMP PREV XXLGXLNG (GOWN DISPOSABLE) ×3 IMPLANT
GOWN PREVENTION PLUS XLARGE (GOWN DISPOSABLE) ×3 IMPLANT
GOWN STRL REIN 2XL XLG LVL4 (GOWN DISPOSABLE) ×2 IMPLANT
NDL SCORPION (NEEDLE) ×1 IMPLANT
NDL SUT 6 .5 CRC .975X.05 MAYO (NEEDLE) IMPLANT
NEEDLE MAYO TAPER (NEEDLE)
NEEDLE MINI RC 24MM (NEEDLE) IMPLANT
NEEDLE SCORPION (NEEDLE) ×3 IMPLANT
PACK ARTHROSCOPY DSU (CUSTOM PROCEDURE TRAY) ×3 IMPLANT
PACK BASIN DAY SURGERY FS (CUSTOM PROCEDURE TRAY) ×3 IMPLANT
PAD CLEANER CAUTERY TIP 5X5 (MISCELLANEOUS)
PASSER SUT SWANSON 36MM LOOP (INSTRUMENTS) IMPLANT
PENCIL BUTTON HOLSTER BLD 10FT (ELECTRODE) ×3 IMPLANT
SLEEVE SCD COMPRESS KNEE MED (MISCELLANEOUS) ×3 IMPLANT
SLING ARM FOAM STRAP LRG (SOFTGOODS) ×2 IMPLANT
SLING ARM FOAM STRAP MED (SOFTGOODS) IMPLANT
SPONGE GAUZE 4X4 12PLY (GAUZE/BANDAGES/DRESSINGS) ×3 IMPLANT
SPONGE LAP 4X18 X RAY DECT (DISPOSABLE) ×3 IMPLANT
STRIP CLOSURE SKIN 1/2X4 (GAUZE/BANDAGES/DRESSINGS) ×3 IMPLANT
SUCTION FRAZIER TIP 10 FR DISP (SUCTIONS) IMPLANT
SUT ETHIBOND 2 OS 4 DA (SUTURE) IMPLANT
SUT ETHILON 4 0 PS 2 18 (SUTURE) IMPLANT
SUT FIBERWIRE #2 38 T-5 BLUE (SUTURE)
SUT FIBERWIRE 3-0 18 TAPR NDL (SUTURE)
SUT PROLENE 1 CT (SUTURE) IMPLANT
SUT PROLENE 3 0 PS 2 (SUTURE) ×3 IMPLANT
SUT TIGER TAPE 7 IN WHITE (SUTURE) IMPLANT
SUT VIC AB 0 CT1 27 (SUTURE)
SUT VIC AB 0 CT1 27XBRD ANBCTR (SUTURE) IMPLANT
SUT VIC AB 0 SH 27 (SUTURE) ×2 IMPLANT
SUT VIC AB 2-0 SH 27 (SUTURE) ×3
SUT VIC AB 2-0 SH 27XBRD (SUTURE) ×1 IMPLANT
SUT VIC AB 3-0 SH 27 (SUTURE)
SUT VIC AB 3-0 SH 27X BRD (SUTURE) IMPLANT
SUT VIC AB 3-0 X1 27 (SUTURE) IMPLANT
SUTURE FIBERWR #2 38 T-5 BLUE (SUTURE) IMPLANT
SUTURE FIBERWR 3-0 18 TAPR NDL (SUTURE) IMPLANT
SYR 3ML 23GX1 SAFETY (SYRINGE) IMPLANT
SYR BULB 3OZ (MISCELLANEOUS) IMPLANT
TAPE FIBER 2MM 7IN #2 BLUE (SUTURE) IMPLANT
TAPE PAPER 3X10 WHT MICROPORE (GAUZE/BANDAGES/DRESSINGS) ×3 IMPLANT
TOWEL OR 17X24 6PK STRL BLUE (TOWEL DISPOSABLE) ×3 IMPLANT
TUBE CONNECTING 20X1/4 (TUBING) ×3 IMPLANT
TUBING ARTHROSCOPY IRRIG 16FT (MISCELLANEOUS) IMPLANT
WAND STAR VAC 90 (SURGICAL WAND) ×3 IMPLANT
WATER STERILE IRR 1000ML POUR (IV SOLUTION) ×3 IMPLANT
YANKAUER SUCT BULB TIP NO VENT (SUCTIONS) IMPLANT

## 2011-09-05 NOTE — Anesthesia Preprocedure Evaluation (Signed)
Anesthesia Evaluation  Patient identified by MRN, date of birth, ID band Patient awake    Reviewed: Allergy & Precautions, H&P , NPO status , Patient's Chart, lab work & pertinent test results  History of Anesthesia Complications Negative for: history of anesthetic complications  Airway Mallampati: I TM Distance: >3 FB Neck ROM: Full    Dental  (+) Teeth Intact and Dental Advisory Given   Pulmonary asthma , COPD (well controlled) COPD inhaler, former smoker clear to auscultation  Pulmonary exam normal       Cardiovascular neg cardio ROS Regular Normal    Neuro/Psych    GI/Hepatic negative GI ROS, Neg liver ROS,   Endo/Other  Negative Endocrine ROS  Renal/GU negative Renal ROS     Musculoskeletal   Abdominal   Peds  Hematology negative hematology ROS (+)   Anesthesia Other Findings   Reproductive/Obstetrics                           Anesthesia Physical Anesthesia Plan  ASA: II  Anesthesia Plan: General   Post-op Pain Management:    Induction: Intravenous  Airway Management Planned: Oral ETT  Additional Equipment:   Intra-op Plan:   Post-operative Plan: Extubation in OR  Informed Consent: I have reviewed the patients History and Physical, chart, labs and discussed the procedure including the risks, benefits and alternatives for the proposed anesthesia with the patient or authorized representative who has indicated his/her understanding and acceptance.   Dental advisory given  Plan Discussed with: CRNA and Surgeon  Anesthesia Plan Comments: (Plan routine monitors, GETA  With interscalene block for post op analgesia  )        Anesthesia Quick Evaluation

## 2011-09-05 NOTE — Discharge Instructions (Signed)
[facility] Instructions Hand Surgery  Wound Care: Keep your hand elevated above the level of your heart.  Do not allow it to dangle  by your side.  Keep the dressing dry and do not remove it unless your doctor advises you to do so.  He will usually change it at the time of your post-op visit.  Moving your fingers is advised to stimulate circulation but will depend on the site of your surgery.  If you have a splint applied, your doctor will advise you regarding movement.  Activity: Do not drive or operate machinery today.  Rest today and then you may return to your normal activity and work as indicated by your physician.  Diet:  Drink liquids today or eat a light diet.  You may resume a regular diet tomorrow.    General expectations: Pain for two to three days. Fingers may become slightly swollen.  Call your doctor if any of the following occur: Severe pain not relieved by pain medication. Elevated temperature. Dressing soaked with blood. Inability to move fingers. White or bluish color to fingers.  Regional Anesthesia Blocks  1. Numbness or the inability to move the "blocked" extremity may last from 3-48 hours after placement. The length of time depends on the medication injected and your individual response to the medication. If the numbness is not going away after 48 hours, call your surgeon.  2. The extremity that is blocked will need to be protected until the numbness is gone and the  Strength has returned. Because you cannot feel it, you will need to take extra care to avoid injury. Because it may be weak, you may have difficulty moving it or using it. You may not know what position it is in without looking at it while the block is in effect.  3. For blocks in the legs and feet, returning to weight bearing and walking needs to be done carefully. You will need to wait until the numbness is entirely gone and the strength has returned. You should be able to move your leg and foot  normally before you try and bear weight or walk. You will need someone to be with you when you first try to ensure you do not fall and possibly risk injury.  4. Bruising and tenderness at the needle site are common side effects and will resolve in a few days.  5. Persistent numbness or new problems with movement should be communicated to the surgeon or the [facility] ([phone]).    [facility]  [address] [place], [place] [address] [phone]   Post Anesthesia Home Care Instructions  Activity: Get plenty of rest for the remainder of the day. A responsible adult should stay with you for 24 hours following the procedure.  For the next 24 hours, DO NOT: -Drive a car -Operate machinery -Drink alcoholic beverages -Take any medication unless instructed by your physician -Make any legal decisions or sign important papers.  Meals: Start with liquid foods such as gelatin or soup. Progress to regular foods as tolerated. Avoid greasy, spicy, heavy foods. If nausea and/or vomiting occur, drink only clear liquids until the nausea and/or vomiting subsides. Call your physician if vomiting continues.  Special Instructions/Symptoms: Your throat may feel dry or sore from the anesthesia or the breathing tube placed in your throat during surgery. If this causes discomfort, gargle with warm salt water. The discomfort should disappear within 24 hours.   

## 2011-09-05 NOTE — Progress Notes (Signed)
Assisted Dr. Jackson with right, interscalene  block. Side rails up, monitors on throughout procedure. See vital signs in flow sheet. Tolerated Procedure well. 

## 2011-09-05 NOTE — Op Note (Signed)
OP NOTE DICTATED;  09/05/11 161096
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
## 2011-09-05 NOTE — Transfer of Care (Signed)
Immediate Anesthesia Transfer of Care Note  Patient: Jean Hudson  Procedure(s) Performed: Procedure(s) (LRB): SHOLDER ARTHROSCOPY WITH OPEN ROTATOR CUFF REPAIR (Right)  Patient Location: PACU  Anesthesia Type: General  Level of Consciousness: awake  Airway & Oxygen Therapy: Patient Spontanous Breathing and Patient connected to face mask oxygen  Post-op Assessment: Report given to PACU RN and Post -op Vital signs reviewed and stable  Post vital signs: Reviewed and stable  Complications: No apparent anesthesia complications

## 2011-09-05 NOTE — Anesthesia Postprocedure Evaluation (Signed)
Anesthesia Post Note  Patient: Jean Hudson  Procedure(s) Performed: Procedure(s) (LRB): SHOLDER ARTHROSCOPY WITH OPEN ROTATOR CUFF REPAIR (Right)  Anesthesia type: General  Patient location: PACU  Post pain: Pain level controlled  Post assessment: Patient's Cardiovascular Status Stable  Last Vitals:  Filed Vitals:   09/05/11 1615  BP: 109/51  Pulse: 72  Temp:   Resp: 14    Post vital signs: Reviewed and stable  Level of consciousness: alert  Complications: No apparent anesthesia complications

## 2011-09-05 NOTE — Brief Op Note (Signed)
09/05/2011  3:00 PM  PATIENT:  Jean Hudson  61 y.o. female  PRE-OPERATIVE DIAGNOSIS:  rotator cuff tear, slap lesion right shoulder, ac degenerative arthtitis  POST-OPERATIVE DIAGNOSIS:  rotator cuff tear, slap lesion right shoulder, ac degenerative athritis  PROCEDURE:  SHOULDER ARTHROSCOPY WITH LABRAL DEBRIDEMENT, ROTATOR CUFF DEBRIDEMENT, SUBACROMIAL DEBRIDEMENT / ACROMIOPLASTY AND ARTHROSCOPIC DISTAL CLAVICLE EXCISION  OPEN ROTATOR CUFF REPAIR DOUBLE ROW WITH DIAMONDBACK TECHNIQUE  SURGEON:   Wyn Forster., MD   PHYSICIAN ASSISTANT:   ASSISTANTS: Mallory Shirk.A-C    ANESTHESIA:   GENERAL BY ENDOTRACHEAL TECHNIQUE  EBL:  Total I/O In: 1000 [I.V.:1000] Out: -   BLOOD ADMINISTERED:none  DRAINS: none   LOCAL MEDICATIONS USED:  MARCAINE     SPECIMEN:  No Specimen  DISPOSITION OF SPECIMEN:  N/A  COUNTS:  YES  TOURNIQUET:  * No tourniquets in log *  DICTATION: .Other Dictation: Dictation Number [id number]  PLAN OF CARE: Discharge to home after PACU  PATIENT DISPOSITION:  PACU - hemodynamically stable.

## 2011-09-05 NOTE — Anesthesia Procedure Notes (Addendum)
Anesthesia Regional Block:  Interscalene brachial plexus block  Pre-Anesthetic Checklist: ,, timeout performed, Correct Patient, Correct Site, Correct Laterality, Correct Procedure, Correct Position, site marked, Risks and benefits discussed,  Surgical consent,  Pre-op evaluation,  At surgeon's request and post-op pain management  Laterality: Right  Prep: chloraprep       Needles:  Injection technique: Single-shot  Needle Type: Stimulator Needle - 40      Needle Gauge: 22 and 22 G    Additional Needles:  Procedures: nerve stimulator Interscalene brachial plexus block  Nerve Stimulator or Paresthesia:  Response: forearm twitch, 0.45 mA, 0.1 ms,   Additional Responses:   Narrative:  Start time: 09/05/2011 12:34 PM End time: 09/05/2011 12:41 PM Injection made incrementally with aspirations every 5 mL.  Performed by: Personally  Anesthesiologist: Sandford Craze, MD  Additional Notes: Pt identified in Holding room.  Monitors applied. Working IV access confirmed. Sterile prep.  #22ga PNS to forearm twitch at 0.19mA threshold.  30cc 0.5% Ropivacaine injected incrementally after negative test dose.  Patient asymptomatic, VSS, no heme aspirated, tolerated well.   Sandford Craze, MD  Interscalene brachial plexus block Procedure Name: Intubation Date/Time: 09/05/2011 1:36 PM Performed by: Radford Pax Pre-anesthesia Checklist: Patient identified, Emergency Drugs available, Suction available, Patient being monitored and Timeout performed Patient Re-evaluated:Patient Re-evaluated prior to inductionOxygen Delivery Method: Circle System Utilized Preoxygenation: Pre-oxygenation with 100% oxygen Intubation Type: IV induction Ventilation: Mask ventilation without difficulty Laryngoscope Size: Miller and 3 Grade View: Grade I Tube type: Oral Number of attempts: 1 Airway Equipment and Method: stylet and oral airway Placement Confirmation: ETT inserted through vocal cords under direct vision,   positive ETCO2 and breath sounds checked- equal and bilateral Secured at: 22 cm Tube secured with: Tape (taped on left with pink tape) Dental Injury: Teeth and Oropharynx as per pre-operative assessment

## 2011-09-06 NOTE — Op Note (Signed)
NAME:  HOLLE, SPRICK             ACCOUNT NO.:  [id number]  MEDICAL RECORD NO.:  [id number]  LOCATION:                                 FACILITY:  PHYSICIAN:  Katy Fitch. Davinia Riccardi, M.D.      DATE OF BIRTH:  DATE OF PROCEDURE:  09/05/2011 DATE OF DISCHARGE:                              OPERATIVE REPORT   PREOPERATIVE DIAGNOSIS:  Two-tendon rotator cuff tear, right shoulder with acromioclavicular degenerative arthritis and unfavorable acromial morphology, also preoperative MRI documentation of a type 1 superior labrum anterior and posterior lesion with apparent intact origin of long head of biceps.  POSTOPERATIVE DIAGNOSIS:  Two-tendon rotator cuff tear, right shoulder with acromioclavicular degenerative arthritis and unfavorable acromial morphology, also preoperative MRI documentation of a type 1 superior labrum anterior and posterior lesion with apparent intact origin of long head of biceps with confirmation of acceptable origin of biceps, significant degenerative tear of supraspinatus and infraspinatus tendons and moderate degree of adhesive capsulitis, granulation tissue within right glenohumeral joint.  OPERATION: 1. Diagnostic arthroscopy, right glenohumeral joint followed by     arthroscopic debridement of synovitis, labral degenerative changes     and degenerative rotator cuff. 2. Arthroscopic subacromial decompression with bursectomy,     coracoacromial ligament relaxation, and acromioplasty. 3. Arthroscopic distal clavicle resection. 4. Open double-row repair of supraspinatus and infraspinatus rotator     cuff tendons to decorticate it and profile lowered greater     tuberosity utilizing an Arthrex double diamondback technique.  OPERATING SURGEON:  Katy Fitch. Ezinne Yogi, MD  ASSISTANT:  Marveen Reeks Dasnoit, PA-C  ANESTHESIA:  General by endotracheal technique supplemented by a right ropivacaine, scalene block placed by Dr. Jairo Ben.  INDICATIONS:  Wynell Halberg is a  61 year old woman referred through the courtesy of Dr. Catha Gosselin for evaluation and management of shoulder pain.  Clinical examination revealed weakness of abduction, external rotation, and impingement signs consistent with a stage III impingement or rotator cuff tear.  An MRI of her shoulder was obtained and documented a significant degenerative tear of the supraspinatus.  It was retracted and considerable degenerative change in the infraspinatus.  Her biceps tendon was stable from its origin at the superior glenoid through the rotator interval; however, she did have a shaggy SLAP-type lesion.  There was a suggestion of a possible loose body in her subscapularis recess.  We advised Ms. Mertens to proceed with diagnostic arthroscopy, debridement, subacromial decompression, distal clavicle resection and repair of the rotator cuff.  We discussed the merits of open versus arthroscopic rotator cuff repair.  We have had a very successful track record of repair very small open repairs with precise suture placement. Therefore, I recommended that it would be in her best interest to pursue that strategy.  After informed consent, she was brought to the operating room at this time.  PROCEDURE:  Abuk Selleck was brought to room #6 of the [facility] and placed in supine position on the operating table.  In the preop holding area, she was interviewed by Dr. Jean Rosenthal of Anesthesia.  Dr. Jean Rosenthal recommended an interscalene block.  This was placed with ropivacaine in the holding area without complication.  After approximately 20  minutes, Mrs. Linehan had excellent anesthesia in the entire right upper extremity and forequarter.  In room #6 under Dr. Edison Pace direct supervision, general endotracheal anesthesia was induced.  She was carefully positioned in the beach-chair position with aid of a torso and head holder designed for shoulder arthroscopy followed by routine DuraPrep of  the right arm and forequarter.  Impervious arthroscopy drapes were applied with sterile stockinette on the arm.  Following routine surgical time-out in which we noted her allergies to Demerol, Flagyl, and intolerance to hydrocodone, we subsequently proceeded with instrumentation of the shoulder with an arthroscope through the standard posterior viewing portal with blunt technique beginning with a switching stick placed anteriorly.  Diagnostic arthroscopy confirmed the full-thickness rotator cuff tear.  This was quite substantial with degenerative changes at the insertion of the supraspinatus and infraspinatus.  Subscapularis was intact.  The teres minor was intact.  The hyaline articular cartilage surfaces of the glenoid and humeral head were intact.  The superior labrum from approximately 2 o'clock anteriorly to 10 o'clock posteriorly was quite degenerative and hanging within the joint.  This was subsequently trimmed to a smooth margin with a suction shaver brought into an anterior portal under direct vision.  The biceps was tested with a nerve hook and found to have a stable origin.  The margins of the rotator cuff tear were thoroughly debrided with a 4.2- mm suction shaver followed by documentation of absence of a loose body in the subscapularis recess and inferior recess.  What appeared to be a loose body appeared to be a diverticulum of synovium.  Ms. Probus did have a moderate-degree of inflammatory synovium within the joint and early adhesive capsulitis tissues.  These were thoroughly debrided to normal-appearing tendon.  The scope was then removed from the glenohumeral joint and placed in the subacromial space.  A thorough bursectomy was accomplished followed by use of electrocautery to release a portion of the coracoacromial ligament.  The acromion was leveled to a type 1 morphology with removal of the significant medial and anterior osteophyte.  After  further hemostasis, the [facility] joint was inspected.  The inferior capsule had worn through.  The distal clavicle was denuded of hyaline cartilage.  The distal centimeter of clavicle was removed arthroscopically with a suction bur.  After hemostasis was achieved, we then marked our anticipated incision with a spinal needle and subsequently perform a 2- cm muscle-splitting incision to access the cuff for repair.  Arthrex self-retaining retractor was placed followed by freshening the margin of the repair.  The power bur was used to decorticate and lower the profile of the greater tuberosity.  Two medial 4.75-mm PEEK corkscrew anchors were placed followed by placement of four medial mattress sutures creating an anatomic medial footprint of the rotator cuff followed by double diamondback technique insetting the sutures laterally to two 4.75 swivel locks.  Excellent compression of the rotator cuff to the tuberosity was achieved with a very low profile.  Hemostasis was achieved and further bursectomy accomplished with a rongeur.  The deltoid split was then repaired with mattress suture of 0 Vicryl.  The scope was then replaced in the joint to confirm a satisfactory repair and no falling at the long head of the biceps.  A Veress satisfactory anatomic footprint was recreated with a 14-mm wide footprint on the greater tuberosity anteriorly and approximately an 8-mm footprint posteriorly.  The wound was lavaged with sterile saline followed by repair of the skin with subcutaneous 2-0 Vicryl and intradermal 3-0  Prolene.  A compressive dressing was applied with Xeroflo, sterile gauze, and paper tape.  For aftercare due to inclement weather anticipated, Ms. Pharris will be discharged to the care of her family at home.  We will provide postoperative analgesia by infiltrating Marcaine into the portals and we will see her back for followup in approximately 20 hours for dressing change and discussion of  postoperative exercise program.  I have encouraged her husband to have her ambulate thoroughly at home tonight and it should be noted that she had passive compression devices on her calves throughout the surgery for deep vein thrombosis prevention.  There were no apparent complications.  Estimated blood loss was less than 50 mL.  For aftercare, she was placed in a sling, awakened from general anesthesia, and transferred to the recovery room in stable signs.     Katy Fitch Uvaldo Rybacki, M.D.     RVS/MEDQ  D:  09/05/2011  T:  09/06/2011  Job:  [id number]  cc:   Caryn Bee L. Little, M.D.

## 2012-05-28 ENCOUNTER — Encounter (HOSPITAL_BASED_OUTPATIENT_CLINIC_OR_DEPARTMENT_OTHER): Payer: Self-pay | Admitting: *Deleted

## 2012-05-28 NOTE — Progress Notes (Signed)
This is pt 3rd time here this yr-rt shoulder 2/13-did well-went home Will go ahead and pack overnight bag and bring all meds

## 2012-05-29 ENCOUNTER — Other Ambulatory Visit: Payer: Self-pay | Admitting: Orthopedic Surgery

## 2012-06-01 NOTE — H&P (Signed)
  Jean Hudson is an 61 y.o. female.   Chief Complaint: c/o chronic and progressive pain left shoulder HPI: Jean Hudson returns for follow up evaluation of new symptoms affecting her left shoulder. She has done very well on the right side following rotator cuff reconstruction in February. She has full motion and excellent strength.  The left side has been aching for the past 3 months. She has difficulty sleeping at night on the left side. The pain radiates beneath the deltoid laterally.     Past Medical History  Diagnosis Date  . Asthma   . Seasonal allergies   . Rotator cuff tear, right 08/2011  . SLAP lesion of shoulder 08/2011    right    Past Surgical History  Procedure Date  . Dorsal compartment release 12/12/2005    right 1st dorsal compartment  . Trigger finger release 07/23/2011    Procedure: RELEASE TRIGGER FINGER/A-1 PULLEY;  Surgeon: Wyn Forster., MD;  Location: [facility];  Service: Orthopedics;  Laterality: Left;  left thumb  . Dorsal compartment release 07/23/2011    Procedure: RELEASE DORSAL COMPARTMENT (DEQUERVAIN);  Surgeon: Wyn Forster., MD;  Location: [facility];  Service: Orthopedics;  Laterality: Left;  1st left dorsal compartment  . Back surgery 1988  . Shoulder arthroscopy 2/13    rt rcr  . Tubal ligation   . Colonoscopy     Family History  Problem Relation Age of Onset  . Anesthesia problems Father     very agitated waking up post-op   Social History:  reports that she has never smoked. She has never used smokeless tobacco. She reports that she does not drink alcohol or use illicit drugs.  Allergies:  Allergies  Allergen Reactions  . Demerol Nausea And Vomiting  . Flagyl (Metronidazole Hcl) Swelling and Rash    JOINT SWELLING  . Hydrocodone Itching    No prescriptions prior to admission    No results found for this or any previous visit (from the past 48 hour(s)).  No results found.   Pertinent  items are noted in HPI.  Height 5\' 4"  (1.626 m), weight 66.679 kg (147 lb).  General appearance: alert Head: Normocephalic, without obvious abnormality Neck: supple, symmetrical, trachea midline Resp: clear to auscultation bilaterally Cardio: regular rate and rhythm GI: normal findings: bowel sounds normal Extremities:Exam of her left shoulder reveals pain with resisted abduction and decreased internal and external rotation. The MRI of her left shoulder. This was obtained a [facility] on 05-08-12. Tienna has a rather large substance tear over her supraspinatus rotator cuff tendon and an unfavorable profile of her distal clavicle.  Pulses: 2+ and symmetric Skin: normal Neurologic: Grossly normal    Assessment/Plan Impression:Left shoulder RC tear with AC arthrosis  Plan: To the OR for left SA with SAD/DCR and RC repair.The procedure, risks,benefits and post-op course were discussed with the patient at length and they were in agreement with the plan.   DASNOIT,Clebert Wenger J 06/01/2012, 3:47 PM     H&P documentation: 06/02/2012  -History and Physical Reviewed  -Patient has been re-examined  -No change in the plan of care  Wyn Forster, MD

## 2012-06-02 ENCOUNTER — Encounter (HOSPITAL_BASED_OUTPATIENT_CLINIC_OR_DEPARTMENT_OTHER): Payer: Self-pay | Admitting: Certified Registered Nurse Anesthetist

## 2012-06-02 ENCOUNTER — Encounter (HOSPITAL_BASED_OUTPATIENT_CLINIC_OR_DEPARTMENT_OTHER)
Admission: RE | Disposition: A | Discharge status: Home / Self Care | Payer: Self-pay | Source: Ambulatory Visit | Attending: Orthopedic Surgery

## 2012-06-02 ENCOUNTER — Ambulatory Visit (HOSPITAL_BASED_OUTPATIENT_CLINIC_OR_DEPARTMENT_OTHER): Payer: BC Managed Care – PPO | Admitting: Certified Registered Nurse Anesthetist

## 2012-06-02 ENCOUNTER — Ambulatory Visit (HOSPITAL_BASED_OUTPATIENT_CLINIC_OR_DEPARTMENT_OTHER)
Admission: RE | Admit: 2012-06-02 | Discharge: 2012-06-03 | Disposition: A | Discharge status: Home / Self Care | Payer: BC Managed Care – PPO | Source: Ambulatory Visit | Attending: Orthopedic Surgery | Admitting: Orthopedic Surgery

## 2012-06-02 ENCOUNTER — Encounter (HOSPITAL_BASED_OUTPATIENT_CLINIC_OR_DEPARTMENT_OTHER): Payer: Self-pay | Admitting: *Deleted

## 2012-06-02 DIAGNOSIS — M19019 Primary osteoarthritis, unspecified shoulder: Secondary | ICD-10-CM | POA: Insufficient documentation

## 2012-06-02 DIAGNOSIS — Y658 Other specified misadventures during surgical and medical care: Secondary | ICD-10-CM | POA: Insufficient documentation

## 2012-06-02 DIAGNOSIS — M719 Bursopathy, unspecified: Secondary | ICD-10-CM | POA: Insufficient documentation

## 2012-06-02 DIAGNOSIS — M24119 Other articular cartilage disorders, unspecified shoulder: Secondary | ICD-10-CM | POA: Insufficient documentation

## 2012-06-02 DIAGNOSIS — J45909 Unspecified asthma, uncomplicated: Secondary | ICD-10-CM | POA: Insufficient documentation

## 2012-06-02 DIAGNOSIS — Y9210 Unspecified residential institution as the place of occurrence of the external cause: Secondary | ICD-10-CM | POA: Insufficient documentation

## 2012-06-02 DIAGNOSIS — M67919 Unspecified disorder of synovium and tendon, unspecified shoulder: Secondary | ICD-10-CM | POA: Insufficient documentation

## 2012-06-02 DIAGNOSIS — T84498A Other mechanical complication of other internal orthopedic devices, implants and grafts, initial encounter: Secondary | ICD-10-CM | POA: Insufficient documentation

## 2012-06-02 HISTORY — PX: SHOULDER ARTHROSCOPY WITH ROTATOR CUFF REPAIR AND SUBACROMIAL DECOMPRESSION: SHX5686

## 2012-06-02 LAB — POCT HEMOGLOBIN-HEMACUE: Hemoglobin: 14.7 g/dL (ref 12.0–15.0)

## 2012-06-02 SURGERY — SHOULDER ARTHROSCOPY WITH ROTATOR CUFF REPAIR AND SUBACROMIAL DECOMPRESSION
Anesthesia: General | Site: Shoulder | Laterality: Left | Wound class: Clean

## 2012-06-02 MED ORDER — IBUPROFEN 600 MG PO TABS
600.0000 mg | ORAL_TABLET | Freq: Four times a day (QID) | ORAL | Status: DC | PRN
  Administered 2012-06-02 (×2): 600 mg via ORAL

## 2012-06-02 MED ORDER — HYDROMORPHONE HCL PF 1 MG/ML IJ SOLN
0.5000 mg | INTRAMUSCULAR | Status: DC | PRN
  Administered 2012-06-03: 1 mg via INTRAVENOUS

## 2012-06-02 MED ORDER — CEFAZOLIN SODIUM 1-5 GM-% IV SOLN
1.0000 g | Freq: Four times a day (QID) | INTRAVENOUS | Status: AC
  Administered 2012-06-02 – 2012-06-03 (×3): 1 g via INTRAVENOUS

## 2012-06-02 MED ORDER — CEPHALEXIN 500 MG PO CAPS: 500.0000 mg | ORAL_CAPSULE | Freq: Three times a day (TID) | ORAL | Status: DC

## 2012-06-02 MED ORDER — ONDANSETRON HCL 4 MG/2ML IJ SOLN: 4.0000 mg | Freq: Four times a day (QID) | INTRAMUSCULAR | Status: DC | PRN

## 2012-06-02 MED ORDER — METOCLOPRAMIDE HCL 5 MG PO TABS: 5.0000 mg | ORAL_TABLET | Freq: Three times a day (TID) | ORAL | Status: DC | PRN

## 2012-06-02 MED ORDER — SODIUM CHLORIDE 0.9 % IV SOLN
INTRAVENOUS | Status: DC
  Administered 2012-06-02: 20 mL/h via INTRAVENOUS

## 2012-06-02 MED ORDER — FLUTICASONE PROPIONATE 50 MCG/ACT NA SUSP: 2.0000 | Freq: Every day | NASAL | Status: DC

## 2012-06-02 MED ORDER — DEXAMETHASONE SODIUM PHOSPHATE 4 MG/ML IJ SOLN
INTRAMUSCULAR | Status: DC | PRN
  Administered 2012-06-02: 10 mg via INTRAVENOUS

## 2012-06-02 MED ORDER — FENTANYL CITRATE 0.05 MG/ML IJ SOLN
50.0000 ug | INTRAMUSCULAR | Status: DC | PRN
  Administered 2012-06-02: 100 ug via INTRAVENOUS

## 2012-06-02 MED ORDER — EPHEDRINE SULFATE 50 MG/ML IJ SOLN
INTRAMUSCULAR | Status: DC | PRN
  Administered 2012-06-02 (×2): 10 mg via INTRAVENOUS

## 2012-06-02 MED ORDER — PROPOFOL 10 MG/ML IV BOLUS
INTRAVENOUS | Status: DC | PRN
  Administered 2012-06-02: 150 mg via INTRAVENOUS

## 2012-06-02 MED ORDER — METOCLOPRAMIDE HCL 5 MG/ML IJ SOLN: 5.0000 mg | Freq: Three times a day (TID) | INTRAMUSCULAR | Status: DC | PRN

## 2012-06-02 MED ORDER — LACTATED RINGERS IV SOLN
INTRAVENOUS | Status: DC
  Administered 2012-06-02 (×3): via INTRAVENOUS

## 2012-06-02 MED ORDER — ONDANSETRON HCL 4 MG/2ML IJ SOLN
INTRAMUSCULAR | Status: DC | PRN
  Administered 2012-06-02: 4 mg via INTRAVENOUS

## 2012-06-02 MED ORDER — OXYCODONE HCL 5 MG PO TABS: 5.0000 mg | ORAL_TABLET | Freq: Once | ORAL | Status: AC | PRN

## 2012-06-02 MED ORDER — HYDROMORPHONE HCL 2 MG PO TABS: ORAL_TABLET | ORAL | Status: DC

## 2012-06-02 MED ORDER — OXYCODONE HCL 5 MG/5ML PO SOLN: 5.0000 mg | Freq: Once | ORAL | Status: AC | PRN

## 2012-06-02 MED ORDER — SODIUM CHLORIDE 0.9 % IR SOLN
Status: DC | PRN
  Administered 2012-06-02: 6

## 2012-06-02 MED ORDER — CHLORHEXIDINE GLUCONATE 4 % EX LIQD: 60.0000 mL | Freq: Once | CUTANEOUS | Status: DC

## 2012-06-02 MED ORDER — LIDOCAINE HCL (CARDIAC) 20 MG/ML IV SOLN
INTRAVENOUS | Status: DC | PRN
  Administered 2012-06-02: 30 mg via INTRAVENOUS

## 2012-06-02 MED ORDER — CEFAZOLIN SODIUM-DEXTROSE 2-3 GM-% IV SOLR
2.0000 g | Freq: Once | INTRAVENOUS | Status: AC
  Administered 2012-06-02: 2 g via INTRAVENOUS

## 2012-06-02 MED ORDER — MIDAZOLAM HCL 5 MG/5ML IJ SOLN
INTRAMUSCULAR | Status: DC | PRN
  Administered 2012-06-02: 1 mg via INTRAVENOUS

## 2012-06-02 MED ORDER — MONTELUKAST SODIUM 10 MG PO TABS
10.0000 mg | ORAL_TABLET | Freq: Every day | ORAL | Status: DC
  Administered 2012-06-02: 10 mg via ORAL

## 2012-06-02 MED ORDER — HYDROMORPHONE HCL 4 MG PO TABS
4.0000 mg | ORAL_TABLET | ORAL | Status: DC | PRN
  Administered 2012-06-02 – 2012-06-03 (×3): 2 mg via ORAL

## 2012-06-02 MED ORDER — IBUPROFEN 600 MG PO TABS: 600.0000 mg | ORAL_TABLET | Freq: Four times a day (QID) | ORAL | Status: DC | PRN

## 2012-06-02 MED ORDER — ONDANSETRON HCL 4 MG/2ML IJ SOLN: 4.0000 mg | Freq: Once | INTRAMUSCULAR | Status: AC | PRN

## 2012-06-02 MED ORDER — MEPERIDINE HCL 25 MG/ML IJ SOLN: 6.2500 mg | INTRAMUSCULAR | Status: DC | PRN

## 2012-06-02 MED ORDER — ROPIVACAINE HCL 5 MG/ML IJ SOLN
INTRAMUSCULAR | Status: DC | PRN
  Administered 2012-06-02: 30 mL via EPIDURAL

## 2012-06-02 MED ORDER — MIDAZOLAM HCL 2 MG/2ML IJ SOLN
1.0000 mg | INTRAMUSCULAR | Status: DC | PRN
  Administered 2012-06-02: 2 mg via INTRAVENOUS

## 2012-06-02 MED ORDER — SUCCINYLCHOLINE CHLORIDE 20 MG/ML IJ SOLN
INTRAMUSCULAR | Status: DC | PRN
  Administered 2012-06-02: 100 mg via INTRAVENOUS

## 2012-06-02 MED ORDER — HYDROMORPHONE HCL PF 1 MG/ML IJ SOLN: 0.2500 mg | INTRAMUSCULAR | Status: DC | PRN

## 2012-06-02 SURGICAL SUPPLY — 87 items
ANCH SUT SWLK 19.1 CLS EYLT TL (Anchor) ×2 IMPLANT
ANCH SUT SWLK 19.1 CLS EYLT VT (Anchor) ×1 IMPLANT
ANCH SUT SWLK 19.1X4.75 (Anchor) ×1 IMPLANT
ANCH SUT SWLK 19.1X5.5 CLS (Anchor) ×1 IMPLANT
ANCHOR BIO SWLOCK 4.75 W/TIG (Anchor) ×1 IMPLANT
ANCHOR SUT BIO SW 4.75 W/FIB (Anchor) ×4 IMPLANT
ANCHOR SUT BIO SW 4.75X19.1 (Anchor) ×2 IMPLANT
ANCHOR SWIVELOCK BIO COMP (Anchor) ×1 IMPLANT
BANDAGE ADHESIVE 1X3 (GAUZE/BANDAGES/DRESSINGS) IMPLANT
BLADE AVERAGE 25X9 (BLADE) IMPLANT
BLADE CUTTER MENIS 5.5 (BLADE) IMPLANT
BLADE SURG 15 STRL LF DISP TIS (BLADE) ×2 IMPLANT
BLADE SURG 15 STRL SS (BLADE) ×4
BUR EGG 3PK/BX (BURR) IMPLANT
BUR OVAL 6.0 (BURR) ×2 IMPLANT
CANISTER OMNI JUG 16 LITER (MISCELLANEOUS) ×3 IMPLANT
CANISTER SUCTION 2500CC (MISCELLANEOUS) IMPLANT
CANNULA TWIST IN 8.25X7CM (CANNULA) ×2 IMPLANT
CLEANER CAUTERY TIP 5X5 PAD (MISCELLANEOUS) IMPLANT
CLOTH BEACON ORANGE TIMEOUT ST (SAFETY) ×2 IMPLANT
CUTTER MENISCUS  4.2MM (BLADE) ×1
CUTTER MENISCUS 4.2MM (BLADE) ×1 IMPLANT
DECANTER SPIKE VIAL GLASS SM (MISCELLANEOUS) IMPLANT
DRAPE INCISE IOBAN 66X45 STRL (DRAPES) ×2 IMPLANT
DRAPE STERI 35X30 U-POUCH (DRAPES) ×2 IMPLANT
DRAPE SURG 17X23 STRL (DRAPES) ×2 IMPLANT
DRAPE U-SHAPE 47X51 STRL (DRAPES) ×2 IMPLANT
DRAPE U-SHAPE 76X120 STRL (DRAPES) ×4 IMPLANT
DRSG PAD ABDOMINAL 8X10 ST (GAUZE/BANDAGES/DRESSINGS) ×2 IMPLANT
DURAPREP 26ML APPLICATOR (WOUND CARE) ×2 IMPLANT
ELECT REM PT RETURN 9FT ADLT (ELECTROSURGICAL) ×2
ELECTRODE REM PT RTRN 9FT ADLT (ELECTROSURGICAL) IMPLANT
GLOVE BIO SURGEON STRL SZ 6.5 (GLOVE) ×1 IMPLANT
GLOVE BIOGEL M STRL SZ7.5 (GLOVE) ×4 IMPLANT
GLOVE BIOGEL PI IND STRL 7.0 (GLOVE) ×1 IMPLANT
GLOVE BIOGEL PI IND STRL 8 (GLOVE) ×2 IMPLANT
GLOVE BIOGEL PI INDICATOR 7.0 (GLOVE) ×1
GLOVE BIOGEL PI INDICATOR 8 (GLOVE) ×2
GLOVE EXAM NITRILE EXT CUFF MD (GLOVE) ×1 IMPLANT
GLOVE ORTHO TXT STRL SZ7.5 (GLOVE) ×2 IMPLANT
GOWN BRE IMP PREV XXLGXLNG (GOWN DISPOSABLE) ×2 IMPLANT
GOWN PREVENTION PLUS XLARGE (GOWN DISPOSABLE) ×1 IMPLANT
GOWN STRL REIN 2XL XLG LVL4 (GOWN DISPOSABLE) ×2 IMPLANT
NDL SCORPION (NEEDLE) ×1 IMPLANT
NDL SUT 6 .5 CRC .975X.05 MAYO (NEEDLE) IMPLANT
NEEDLE MAYO TAPER (NEEDLE) ×2
NEEDLE MINI RC 24MM (NEEDLE) IMPLANT
NEEDLE SCORPION (NEEDLE) ×2 IMPLANT
PACK ARTHROSCOPY DSU (CUSTOM PROCEDURE TRAY) ×2 IMPLANT
PACK BASIN DAY SURGERY FS (CUSTOM PROCEDURE TRAY) ×2 IMPLANT
PAD CLEANER CAUTERY TIP 5X5 (MISCELLANEOUS)
PASSER SUT SWANSON 36MM LOOP (INSTRUMENTS) IMPLANT
PENCIL BUTTON HOLSTER BLD 10FT (ELECTRODE) ×1 IMPLANT
SLEEVE SCD COMPRESS KNEE MED (MISCELLANEOUS) ×2 IMPLANT
SLING ARM FOAM STRAP LRG (SOFTGOODS) ×1 IMPLANT
SLING ARM FOAM STRAP MED (SOFTGOODS) IMPLANT
SPONGE GAUZE 4X4 12PLY (GAUZE/BANDAGES/DRESSINGS) ×2 IMPLANT
SPONGE LAP 4X18 X RAY DECT (DISPOSABLE) ×1 IMPLANT
STRIP CLOSURE SKIN 1/2X4 (GAUZE/BANDAGES/DRESSINGS) ×1 IMPLANT
SUCTION FRAZIER TIP 10 FR DISP (SUCTIONS) IMPLANT
SUT ETHIBOND 2 OS 4 DA (SUTURE) IMPLANT
SUT ETHILON 4 0 PS 2 18 (SUTURE) IMPLANT
SUT FIBERWIRE #2 38 T-5 BLUE (SUTURE)
SUT FIBERWIRE 3-0 18 TAPR NDL (SUTURE)
SUT PROLENE 1 CT (SUTURE) ×1 IMPLANT
SUT PROLENE 3 0 PS 2 (SUTURE) ×2 IMPLANT
SUT TIGER TAPE 7 IN WHITE (SUTURE) IMPLANT
SUT VIC AB 0 CT1 27 (SUTURE)
SUT VIC AB 0 CT1 27XBRD ANBCTR (SUTURE) IMPLANT
SUT VIC AB 0 SH 27 (SUTURE) ×1 IMPLANT
SUT VIC AB 2-0 SH 27 (SUTURE) ×2
SUT VIC AB 2-0 SH 27XBRD (SUTURE) IMPLANT
SUT VIC AB 3-0 SH 27 (SUTURE)
SUT VIC AB 3-0 SH 27X BRD (SUTURE) IMPLANT
SUT VIC AB 3-0 X1 27 (SUTURE) IMPLANT
SUTURE FIBERWR #2 38 T-5 BLUE (SUTURE) IMPLANT
SUTURE FIBERWR 3-0 18 TAPR NDL (SUTURE) IMPLANT
SYR 3ML 23GX1 SAFETY (SYRINGE) IMPLANT
SYR BULB 3OZ (MISCELLANEOUS) IMPLANT
TAPE FIBER 2MM 7IN #2 BLUE (SUTURE) ×2 IMPLANT
TAPE PAPER 3X10 WHT MICROPORE (GAUZE/BANDAGES/DRESSINGS) ×2 IMPLANT
TOWEL OR 17X24 6PK STRL BLUE (TOWEL DISPOSABLE) ×2 IMPLANT
TUBE CONNECTING 20X1/4 (TUBING) ×2 IMPLANT
TUBING ARTHROSCOPY IRRIG 16FT (MISCELLANEOUS) ×2 IMPLANT
WAND STAR VAC 90 (SURGICAL WAND) ×2 IMPLANT
WATER STERILE IRR 1000ML POUR (IV SOLUTION) ×2 IMPLANT
YANKAUER SUCT BULB TIP NO VENT (SUCTIONS) IMPLANT

## 2012-06-02 NOTE — Anesthesia Procedure Notes (Addendum)
Anesthesia Regional Block:  Interscalene brachial plexus block  Pre-Anesthetic Checklist: ,, timeout performed, Correct Patient, Correct Site, Correct Laterality, Correct Procedure, Correct Position, site marked, Risks and benefits discussed,  Surgical consent,  Pre-op evaluation,  At surgeon's request and post-op pain management  Laterality: Left  Prep: chloraprep       Needles:  Injection technique: Single-shot  Needle Type: Echogenic Stimulator Needle     Needle Length: 5cm 5 cm     Additional Needles:  Procedures: ultrasound guided (picture in chart) and nerve stimulator Interscalene brachial plexus block  Nerve Stimulator or Paresthesia:  Response: 0.4 mA,   Additional Responses:   Narrative:  Start time: 06/02/2012 7:42 AM End time: 06/02/2012 7:48 AM Injection made incrementally with aspirations every 5 mL.  Performed by: Personally  Anesthesiologist: Arta Bruce MD  Additional Notes: Monitors applied. Patient sedated. Sterile prep and drape,hand hygiene and sterile gloves were used. Relevant anatomy identified.Needle position confirmed.Local anesthetic injected incrementally after negative aspiration. Local anesthetic spread visualized around nerve(s). Vascular puncture avoided. No complications. Image printed for medical record.The patient tolerated the procedure well.       Interscalene brachial plexus block Procedure Name: Intubation Date/Time: 06/02/2012 8:41 AM Performed by: Taysha Majewski D Pre-anesthesia Checklist: Patient identified, Emergency Drugs available, Suction available and Patient being monitored Patient Re-evaluated:Patient Re-evaluated prior to inductionOxygen Delivery Method: Circle System Utilized Preoxygenation: Pre-oxygenation with 100% oxygen Intubation Type: IV induction Ventilation: Mask ventilation without difficulty Laryngoscope Size: Mac and 3 Grade View: Grade I Tube type: Oral Tube size: 7.0 mm Number of attempts:  1 Airway Equipment and Method: stylet,  oral airway and LTA kit utilized Placement Confirmation: ETT inserted through vocal cords under direct vision,  positive ETCO2 and breath sounds checked- equal and bilateral Secured at: 22 cm Tube secured with: Tape Dental Injury: Teeth and Oropharynx as per pre-operative assessment

## 2012-06-02 NOTE — Progress Notes (Signed)
AssistedDr. Ossey with left, ultrasound guided, interscalene  block. Side rails up, monitors on throughout procedure. See vital signs in flow sheet. Tolerated Procedure well.  

## 2012-06-02 NOTE — Anesthesia Postprocedure Evaluation (Signed)
Anesthesia Post Note  Patient: Jean Hudson  Procedure(s) Performed: Procedure(s) (LRB): SHOULDER ARTHROSCOPY WITH ROTATOR CUFF REPAIR AND SUBACROMIAL DECOMPRESSION (Left)  Anesthesia type: general  Patient location: PACU  Post pain: Pain level controlled  Post assessment: Patient's Cardiovascular Status Stable  Last Vitals:  Filed Vitals:   06/02/12 1200  BP: 141/62  Pulse: 80  Temp:   Resp: 13    Post vital signs: Reviewed and stable  Level of consciousness: sedated  Complications: No apparent anesthesia complications

## 2012-06-02 NOTE — Op Note (Signed)
[id number] 

## 2012-06-02 NOTE — Brief Op Note (Signed)
06/02/2012  10:40 AM  PATIENT:  Jean Hudson  61 y.o. female  PRE-OPERATIVE DIAGNOSIS:  LEFT ROTATOR CUFF TEAR, ACROMIOCLAVICULAR JOINT DEGENERATIVE JOINT DISEASE  POST-OPERATIVE DIAGNOSIS:  LEFT ROTATOR CUFF TEAR, ACROMIOCLAVICULAR JOINT DEGENERATIVE JOINT DISEASE  PROCEDURE:  Procedure(s) (LRB) with comments: SHOULDER ARTHROSCOPY WITH ROTATOR CUFF REPAIR AND SUBACROMIAL DECOMPRESSION (Left) - LEFT SHOULDER ARTHROSCOPY WITH SUBACROMIAL DECOMPRESSION, DISTAL CLAVICLE RESECTION AND REPAIR ROTATOR CUFF  SURGEON:  Surgeon(s) and Role:    * Wyn Forster., MD - Primary  PHYSICIAN ASSISTANT:   ASSISTANTS:Thana Ramp Dasnoit,P.A-C  ANESTHESIA:   general  EBL:  Total I/O In: 1700 [I.V.:1700] Out: -   BLOOD ADMINISTERED:none  DRAINS: none   LOCAL MEDICATIONS USED:  LIDOCAINE   SPECIMEN:  No Specimen  DISPOSITION OF SPECIMEN:  N/A  COUNTS:  YES  TOURNIQUET:  * No tourniquets in log *  DICTATION: .Other Dictation: Dictation Number [id number]  PLAN OF CARE: Admit for overnight observation  PATIENT DISPOSITION:  PACU - hemodynamically stable.

## 2012-06-02 NOTE — Transfer of Care (Signed)
Immediate Anesthesia Transfer of Care Note  Patient: Jean Hudson  Procedure(s) Performed: Procedure(s) (LRB) with comments: SHOULDER ARTHROSCOPY WITH ROTATOR CUFF REPAIR AND SUBACROMIAL DECOMPRESSION (Left) - LEFT SHOULDER ARTHROSCOPY WITH SUBACROMIAL DECOMPRESSION, DISTAL CLAVICLE RESECTION AND REPAIR ROTATOR CUFF  Patient Location: PACU  Anesthesia Type:GA combined with regional for post-op pain  Level of Consciousness: awake and alert   Airway & Oxygen Therapy: Patient Spontanous Breathing and Patient connected to face mask oxygen  Post-op Assessment: Report given to PACU RN and Post -op Vital signs reviewed and stable  Post vital signs: Reviewed and stable  Complications: No apparent anesthesia complications

## 2012-06-02 NOTE — Anesthesia Preprocedure Evaluation (Signed)
Anesthesia Evaluation  Patient identified by MRN, date of birth, ID band Patient awake    Reviewed: Allergy & Precautions, H&P , NPO status , Patient's Chart, lab work & pertinent test results  Airway Mallampati: I TM Distance: >3 FB Neck ROM: Full    Dental   Pulmonary asthma ,          Cardiovascular     Neuro/Psych    GI/Hepatic   Endo/Other    Renal/GU      Musculoskeletal   Abdominal   Peds  Hematology   Anesthesia Other Findings   Reproductive/Obstetrics                           Anesthesia Physical Anesthesia Plan  ASA: II  Anesthesia Plan: General   Post-op Pain Management:    Induction: Intravenous  Airway Management Planned: Oral ETT  Additional Equipment:   Intra-op Plan:   Post-operative Plan: Extubation in OR  Informed Consent: I have reviewed the patients History and Physical, chart, labs and discussed the procedure including the risks, benefits and alternatives for the proposed anesthesia with the patient or authorized representative who has indicated his/her understanding and acceptance.     Plan Discussed with: CRNA and Surgeon  Anesthesia Plan Comments:         Anesthesia Quick Evaluation  

## 2012-06-03 NOTE — Op Note (Signed)
NAMESHERRINE, SALBERG               ACCOUNT NO.:  [id number]  MEDICAL RECORD NO.:  [id number]  LOCATION:                                 FACILITY:  PHYSICIAN:  Katy Fitch. Ashely Goosby, M.D. DATE OF BIRTH:  13-Feb-1951  DATE OF PROCEDURE:  06/02/2012 DATE OF DISCHARGE:                              OPERATIVE REPORT   PREOPERATIVE DIAGNOSIS:  MRI documented significant left shoulder rotator cuff PASTA tear of supraspinatus, portion of infraspinatus, also very unfavorable acromioclavicular anatomy due to degenerative arthritis and prominent inferior distal clavicle and anterior medial acromion.  POSTOPERATIVE DIAGNOSIS:  Moderately severe labral degenerative tear involving superior and anterior labrum.  OPERATIONS: 1. Diagnostic arthroscopy of left glenohumeral joint. 2. Arthroscopic debridement of labrum and 90% PASTA tear of     supraspinatus with retraction of supraspinatus deep fibers. 3. Subacromial decompression with bursectomy, coracoacromial ligament     release and acromioplasty. 4. Arthroscopic distal clavicle resection. 5. Arthroscopic reconstruction of PASTA tear of supraspinatus and     portion of infraspinatus utilizing double diamondback technique     with mattress sutures x2. 6. A small incision was by necessity created posteriorly to recover a     fractured biocomposite anchor.  OPERATING SURGEON:  Katy Fitch. Jaeliana Lococo, MD  ASSISTANT:  Marveen Reeks Dasnoit, PA  ANESTHESIA:  General tracheal supplemented by a left interscalene block placed with ultrasound, controlled by Kaylyn Layer. Michelle Piper, MD  INDICATIONS:  Virtie Bungert is a 61 year old patient, well acquainted with our practice.  She is status post reconstruction of her right rotator cuff earlier in 2013.  She returned with similar pain on the left side and requested clinical examination and MRI to determine whether or not she had a rotator cuff tear on the left side.  An MRI obtained, did indeed reveal a 90% plus PASTA tear  of the supraspinatus with retraction of the deep fibers.  She had very unfavorable medial acromial and distal clavicle anatomy at the [facility] joint which was quite degenerative.  Ms. Corriher requested that her surgery be accomplished in November 2013, for multiple reasons including her work schedule.  She understands that I will be on sabbatical during the first quarter of 2014.  She completely understands the surgery and aftercare experience and is willing to proceed at this time, understanding that her aftercare will be under the supervision of my partners during weeks 8 through 12.  Questions were invited and answered in detail preoperatively.  DESCRIPTION OF PROCEDURE:  Sway Guttierrez was interviewed in the holding area and her left shoulder marked for proper surgical site identification protocol.  Dr. Michelle Piper provided detailed anesthesia informed consent and after informed consent, placed a left ropivacaine plexus block without complication utilizing ultrasound guidance.  Satisfactory anesthesia in left shoulder and forequarter was obtained.  Ms. Overall was transferred to room 2 of the [facility], where under Dr. Deirdre Priest direct supervision, general endotracheal anesthesia was induced followed by careful position in the beach-chair position with aid of a torso and head holder designed for shoulder arthroscopy.  The entire left upper extremity and forequarter were prepped with DuraPrep and draped with impervious arthroscopy drapes.  A 2 g of Ancef was  administered as an IV prophylactic antibiotic per weight protocol.  Following a routine surgical time-out, procedure commenced with placing the arthroscope through a standard posterior viewing portal using anterior switching stick technique.  Diagnostic arthroscopy revealed a very degenerative superior and anterior labrum.  The biceps origin was stable.  The biceps tendon was normal through the rotator interval.  The hyaline  articular cartilage surfaces of the glenoid and humeral head were intact.  There was a mild degree of granulation tissue on the deep surface of the rotator cuff and a very large PASTA degenerative tear. After using an anterior portal suction shaver to debride the PASTA lesion, the labrum, and the granulation tissue, we documented the PASTA tear with digital camera.  We then decorticated the greater tuberosity with the suction shaver to bleeding bone, and carefully examined the inferior recess and found no other pathology.  We determined this tear was amenable to an arthroscopic trans-tendon technique.  We subsequently entered the bursa and with the aid of a suction shaver, performed bursectomy, coracoacromial ligament release, hemostasis, and leveling the acromion to a type 1 morphology.  The distal clavicle was very prominent.  The capsule was taken down the cutting cautery and the distal centimeter of clavicle removed arthroscopically.  Hemostasis was achieved with bipolar cautery followed by careful bursectomy to allow visualization of the greater tuberosity and lateral gutter.  We then proceeded to place the scope back in the glenohumeral joint and with trans-tendon technique, placed an anterior mattress suture just behind the biceps tendon at the junction of the hyaline cartilage in the greater tuberosity.  We subsequently used bird beak penetrators to pass the fiber wire suture through an 8-mm wide mattress to create an excellent medial footprint.  We then placed a posterior anchor with trans-tendon technique at the margin of the supraspinatus, infraspinatus.  This was somewhat challenging to see due to lack of ability to retract the tendon.  We ultimately placed a nerve hook within the joint from the anterior portal and allowed visualization as we punched and placed our anchor.  The anchor was driven home until there was no resistance.  We then checked and found the superior  portion of the anchor due to Mrs. Edward's very dense bone had fractured and the anchor was still proud by at least 3 mm.  We attempted to thread the anchor with the driver to drive the anchor deeper, however, due to the fracture of the anchor, this was technically not feasible and rather than have the risk of anchor fragments within the joint.  I elected to retrieve the anchor.  We enlarged the anterior, superior, lateral portal to allow visualization through the bursa to the cuff.  I followed the posterior sutures to the anchor with a tendon splitting technique, retracting with a mini-gelpi retractor.  We were able to simply remove the anchor by unwinding the anchor and applying traction.  We confirmed that all components were removed including the tip, the entire anchor, and a small fragment of hyaline cartilage at the site of the puncture point. We then re-punched the hole deeper, placed a fresh 4.5 and 5 mm bio- corkscrew anchor and countersunk it well into the greater tuberosity and performed a placement of mattress suture of both the fiber tape and FiberWire with a Mayo needle, and then performed a double diamondback repair per Arthrex with 2 medial mattress sutures, creating anatomic footprint.  Great care was taken to recover the deep fibers with the arthroscopic bird beak penetrator  and restore than to an anatomic footprint.  After the repair was completed with 2 empty lateral swivel locks, we placed the scope in the glenohumeral joint and confirmed recreation of an anatomic footprint of the supraspinatus.  The subacromial space was thoroughly lavaged with sterile saline as was the glenohumeral joint.  We then repaired the deltoid split with mattress suture of 0 Vicryl followed by repair of the skin with subcutaneous 0 and 2-0 Vicryl, it was repaired with intradermal 3-0 Prolene.  Compressive dressing was applied with sterile gauze and paper tape followed by a sling.  Ms.  Cottone was transferred to the recovery room in stable signs.  There were no apparent complications.     Katy Fitch Ashok Sawaya, M.D.     RVS/MEDQ  D:  06/02/2012  T:  06/03/2012  Job:  [id number]  cc:   Caryn Bee L. Little, M.D.

## 2012-06-04 ENCOUNTER — Encounter (HOSPITAL_BASED_OUTPATIENT_CLINIC_OR_DEPARTMENT_OTHER): Payer: Self-pay | Admitting: Orthopedic Surgery

## 2014-03-31 ENCOUNTER — Ambulatory Visit: Payer: BC Managed Care – PPO | Admitting: Interventional Cardiology

## 2014-04-19 ENCOUNTER — Ambulatory Visit (INDEPENDENT_AMBULATORY_CARE_PROVIDER_SITE_OTHER): Payer: BC Managed Care – PPO | Admitting: Interventional Cardiology

## 2014-04-19 ENCOUNTER — Encounter: Payer: Self-pay | Admitting: Interventional Cardiology

## 2014-04-19 VITALS — BP 140/100 | HR 65 | Ht 65.0 in | Wt 150.4 lb

## 2014-04-19 DIAGNOSIS — Z13220 Encounter for screening for lipoid disorders: Secondary | ICD-10-CM

## 2014-04-19 DIAGNOSIS — M25519 Pain in unspecified shoulder: Secondary | ICD-10-CM

## 2014-04-19 DIAGNOSIS — Z8249 Family history of ischemic heart disease and other diseases of the circulatory system: Secondary | ICD-10-CM

## 2014-04-19 NOTE — Patient Instructions (Addendum)
Your physician recommends that you return for a FASTING lipid and Cmet on the day of GXT.  Your physician has requested that you have an exercise tolerance test. For further information please visit [email or web address]. Please also follow instruction sheet, as given.

## 2014-04-19 NOTE — Progress Notes (Signed)
Patient ID: Jean Hudson, female   DOB: 05/10/1951, 63 y.o.   MRN: [id number]     Patient ID: TY OSHIMA MRN: [id number] DOB/AGE: 1951/04/29 63 y.o.   Referring Physician Dr. Hulan Fess   Reason for Consultation  RF for CAD  HPI: 63 y/o who has a family h/o CAD, mother with MI at age 93-first cousin who had CAD at 32.  She has had chronic shoulder pain.  SHe has had bilateral shoulder surgeries.  She has chronic shoulder pain on the left-similar to what she had with a rotator cuff tear.  Cleaning house is her most strenuous activity and yard work as well.  She climbs ladders as well 1-2 days/week.   Exertion without picking anything up does not hurt shoulders.   She also has chronic back issues form a previously herniated disc.     Current Outpatient Prescriptions  Medication Sig Dispense Refill  . fluticasone-salmeterol (ADVAIR HFA) 230-21 MCG/ACT inhaler Inhale 2 puffs into the lungs daily.       . meloxicam (MOBIC) 15 MG tablet Take 15 mg by mouth daily.      . montelukast (SINGULAIR) 10 MG tablet Take 10 mg by mouth at bedtime.        No current facility-administered medications for this visit.   Past Medical History  Diagnosis Date  . Asthma   . Seasonal allergies   . Rotator cuff tear, right 08/2011  . SLAP lesion of shoulder 08/2011    right    Family History  Problem Relation Age of Onset  . Anesthesia problems Father     very agitated waking up post-op    History   Social History  . Marital Status: Married    Spouse Name: N/A    Number of Children: N/A  . Years of Education: N/A   Occupational History  . Not on file.   Social History Main Topics  . Smoking status: Never Smoker   . Smokeless tobacco: Never Used  . Alcohol Use: No  . Drug Use: No  . Sexual Activity:    Other Topics Concern  . Not on file   Social History Narrative  . No narrative on file    Past Surgical History  Procedure Laterality Date  . Dorsal compartment release   12/12/2005    right 1st dorsal compartment  . Trigger finger release  07/23/2011    Procedure: RELEASE TRIGGER FINGER/A-1 PULLEY;  Surgeon: Cammie Sickle., MD;  Location: [facility];  Service: Orthopedics;  Laterality: Left;  left thumb  . Dorsal compartment release  07/23/2011    Procedure: RELEASE DORSAL COMPARTMENT (DEQUERVAIN);  Surgeon: Cammie Sickle., MD;  Location: [facility];  Service: Orthopedics;  Laterality: Left;  1st left dorsal compartment  . Back surgery  1988  . Shoulder arthroscopy  2/13    rt rcr  . Tubal ligation    . Colonoscopy    . Shoulder arthroscopy with rotator cuff repair and subacromial decompression  06/02/2012    Procedure: SHOULDER ARTHROSCOPY WITH ROTATOR CUFF REPAIR AND SUBACROMIAL DECOMPRESSION;  Surgeon: Cammie Sickle., MD;  Location: [facility];  Service: Orthopedics;  Laterality: Left;  LEFT SHOULDER ARTHROSCOPY WITH SUBACROMIAL DECOMPRESSION, DISTAL CLAVICLE RESECTION AND REPAIR ROTATOR CUFF      (Not in a hospital admission)  Review of systems complete and found to be negative unless listed above .  No nausea, vomiting.  No fever chills, No  focal weakness,  No palpitations.  Physical Exam: Filed Vitals:   04/19/14 0813  BP: 140/100  Pulse: 65    Weight: 150 lb 6.4 oz (68.221 kg)  Physical exam:  [place]/AT EOMI No JVD, No carotid bruit RRR S1S2  No wheezing Soft. NT, nondistended No edema. No focal motor or sensory deficits Normal affect  Labs:   Lab Results  Component Value Date   HGB 14.7 06/02/2012   No results found for this basename: NA, K, CL, CO2, BUN, CREATININE, CALCIUM, LABALBU, PROT, BILITOT, ALKPHOS, ALT, AST, GLUCOSE,  in the last 168 hours No results found for this basename: CKTOTAL, CKMB, CKMBINDEX, TROPONINI    No results found for this basename: CHOL   No results found for this basename: HDL   No results found for this basename: LDLCALC   No results found for  this basename: TRIG   No results found for this basename: CHOLHDL   No results found for this basename: LDLDIRECT       KPV:VZSMOL  ASSESSMENT AND PLAN:  1) shoulder pain: Atypical for cardiac pain. Given her family history of coronary artery disease, we'll plan for exercise treadmill test. As part of her stridor modification, she'll also need a fasting lipid profile for screening. She does admit to having high cholesterol in the past but apparently had a good ratio of LDL to HDL.  Recommended regular aerobic exercise, at least 5 days a week for 30 minutes a day.  Signed:   Mina Marble, MD, [facility] 04/19/2014, 8:40 AM

## 2014-05-11 ENCOUNTER — Telehealth: Payer: Self-pay | Admitting: *Deleted

## 2014-05-11 NOTE — Telephone Encounter (Signed)
Staff message received from scheduling that the patient called and cancelled her GXT for now due to insurance and not being able to afford it at this time.

## 2014-05-12 ENCOUNTER — Other Ambulatory Visit (INDEPENDENT_AMBULATORY_CARE_PROVIDER_SITE_OTHER): Payer: BC Managed Care – PPO | Admitting: *Deleted

## 2014-05-12 DIAGNOSIS — Z13220 Encounter for screening for lipoid disorders: Secondary | ICD-10-CM

## 2014-05-12 LAB — LIPID PANEL
CHOL/HDL RATIO: 6
Cholesterol: 273 mg/dL — ABNORMAL HIGH (ref 0–200)
HDL: 44.6 mg/dL (ref >39.00)
LDL Cholesterol: 202 mg/dL — ABNORMAL HIGH (ref 0–99)
NONHDL: 228.4
Triglycerides: 130 mg/dL (ref 0.0–149.0)
VLDL: 26 mg/dL (ref 0.0–40.0)

## 2014-05-12 LAB — COMPREHENSIVE METABOLIC PANEL
ALT: 14 U/L (ref 0–35)
AST: 21 U/L (ref 0–37)
Albumin: 3.3 g/dL — ABNORMAL LOW (ref 3.5–5.2)
Alkaline Phosphatase: 104 U/L (ref 39–117)
BUN: 12 mg/dL (ref 6–23)
CALCIUM: 9 mg/dL (ref 8.4–10.5)
CHLORIDE: 105 meq/L (ref 96–112)
CO2: 28 mEq/L (ref 19–32)
CREATININE: 0.9 mg/dL (ref 0.4–1.2)
GFR: 71.55 mL/min (ref >60.00)
Glucose, Bld: 79 mg/dL (ref 70–99)
POTASSIUM: 3.6 meq/L (ref 3.5–5.1)
Sodium: 138 mEq/L (ref 135–145)
Total Bilirubin: 1 mg/dL (ref 0.2–1.2)
Total Protein: 6.6 g/dL (ref 6.0–8.3)

## 2014-05-16 ENCOUNTER — Other Ambulatory Visit: Payer: Self-pay | Admitting: *Deleted

## 2014-05-16 DIAGNOSIS — E782 Mixed hyperlipidemia: Secondary | ICD-10-CM

## 2014-05-16 MED ORDER — ATORVASTATIN CALCIUM 10 MG PO TABS: 10.0000 mg | ORAL_TABLET | Freq: Every day | ORAL | Status: DC

## 2014-08-16 ENCOUNTER — Other Ambulatory Visit: Payer: BC Managed Care – PPO

## 2015-03-17 DIAGNOSIS — M65332 Trigger finger, left middle finger: Secondary | ICD-10-CM | POA: Diagnosis not present

## 2015-03-17 DIAGNOSIS — M65342 Trigger finger, left ring finger: Secondary | ICD-10-CM | POA: Diagnosis not present

## 2015-03-22 ENCOUNTER — Other Ambulatory Visit: Payer: Self-pay | Admitting: Orthopedic Surgery

## 2015-03-24 ENCOUNTER — Telehealth: Payer: Self-pay | Admitting: Interventional Cardiology

## 2015-03-24 NOTE — Telephone Encounter (Signed)
New message       Pt states Dr Irish Lack wanted her to have a stress test in 2015.  She wanted to wait until early 2016 because of insurance.  She forgot to call.  She now want to have the stress test if Dr Irish Lack thinks it is still necessary.

## 2015-03-24 NOTE — Telephone Encounter (Signed)
Is she still having shoulder pain?

## 2015-03-24 NOTE — Telephone Encounter (Signed)
Spoke with pt- pt is not currently having any cardiac issues, states she feels fine. Pt states that she forgot to call after the beginning of the year to have it done and just wanted to know if Dr. Irish Lack still felt she needed this done or if he would like to see her back in the office first?

## 2015-03-27 NOTE — Telephone Encounter (Signed)
Informed pt that Dr. Irish Lack ok with holding off on ETT if she is not having any sx with exercise. Pt denies any sx at this time. Advised pt that if she was to develop sx to please let us know. Pt verbalized understanding and was in agreement with this plan.

## 2015-03-27 NOTE — Telephone Encounter (Signed)
If she is not having symptoms with exercise, ok to hold off at this time on ETT.

## 2015-03-27 NOTE — Telephone Encounter (Signed)
Spoke with pt and she denies any shoulder pain and denies any cardiac issues. Will route this updated information to Dr. Irish Lack.

## 2015-03-29 ENCOUNTER — Encounter (HOSPITAL_BASED_OUTPATIENT_CLINIC_OR_DEPARTMENT_OTHER): Payer: Self-pay | Admitting: *Deleted

## 2015-03-30 ENCOUNTER — Encounter: Payer: Self-pay | Admitting: Podiatry

## 2015-03-30 ENCOUNTER — Ambulatory Visit (INDEPENDENT_AMBULATORY_CARE_PROVIDER_SITE_OTHER): Payer: Medicare Other | Admitting: Podiatry

## 2015-03-30 ENCOUNTER — Ambulatory Visit (INDEPENDENT_AMBULATORY_CARE_PROVIDER_SITE_OTHER): Payer: Medicare Other

## 2015-03-30 VITALS — BP 147/82 | HR 68 | Resp 16 | Ht 65.0 in | Wt 145.0 lb

## 2015-03-30 DIAGNOSIS — M79671 Pain in right foot: Secondary | ICD-10-CM

## 2015-03-30 DIAGNOSIS — M2011 Hallux valgus (acquired), right foot: Secondary | ICD-10-CM | POA: Diagnosis not present

## 2015-03-30 DIAGNOSIS — L603 Nail dystrophy: Secondary | ICD-10-CM | POA: Diagnosis not present

## 2015-03-30 DIAGNOSIS — M21611 Bunion of right foot: Secondary | ICD-10-CM

## 2015-03-30 NOTE — Progress Notes (Signed)
   Subjective:    Patient ID: Jean Hudson, female    DOB: 04/10/1951, 64 y.o.   MRN: [id number]  HPI Patient presents with a nail problem on their left foot, great toe, discoloration. Pt stated, "nail feels loose". This has been going on for the past  3 months.  Patient also presents with a bunion on their right foot. Pt stated, "has pain on and off because of bunion".   Review of Systems  Musculoskeletal: Positive for back pain.  Allergic/Immunologic: Positive for environmental allergies.  All other systems reviewed and are negative.      Objective:   Physical Exam        Assessment & Plan:

## 2015-03-30 NOTE — Progress Notes (Signed)
Subjective:     Patient ID: Jean Hudson, female   DOB: [birth year], 64 y.o.   MRN: [id number]  HPI patient presents stating I been getting a structural deformity of the big toe joint right and my nail on my left can turn colors and become irritated but not painful. States the bunion is been present for a long time   Review of Systems  All other systems reviewed and are negative.      Objective:   Physical Exam  Constitutional: She is oriented to person, place, and time.  Cardiovascular: Intact distal pulses.   Musculoskeletal: Normal range of motion.  Neurological: She is oriented to person, place, and time.  Skin: Skin is warm.  Nursing note and vitals reviewed.  neurovascular status intact with muscle strength adequate and range of motion within normal limits. Patient's noted to have hyperostosis medial aspect first metatarsal head right that's red and is noted to have nail disease left big toe with bruising like discoloration. 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 Patient will be seen back to recheck

## 2015-04-05 ENCOUNTER — Encounter (HOSPITAL_BASED_OUTPATIENT_CLINIC_OR_DEPARTMENT_OTHER)
Admission: RE | Admit: 2015-04-05 | Discharge: 2015-04-05 | Disposition: A | Discharge status: Home / Self Care | Payer: Medicare Other | Source: Ambulatory Visit | Attending: Orthopedic Surgery | Admitting: Orthopedic Surgery

## 2015-04-05 DIAGNOSIS — Z87891 Personal history of nicotine dependence: Secondary | ICD-10-CM | POA: Diagnosis not present

## 2015-04-05 DIAGNOSIS — E785 Hyperlipidemia, unspecified: Secondary | ICD-10-CM | POA: Diagnosis not present

## 2015-04-05 DIAGNOSIS — M65332 Trigger finger, left middle finger: Secondary | ICD-10-CM | POA: Diagnosis not present

## 2015-04-05 DIAGNOSIS — M65342 Trigger finger, left ring finger: Secondary | ICD-10-CM | POA: Diagnosis not present

## 2015-04-05 LAB — BASIC METABOLIC PANEL
Anion gap: 8 (ref 5–15)
BUN: 10 mg/dL (ref 6–20)
CALCIUM: 9.6 mg/dL (ref 8.9–10.3)
CHLORIDE: 102 mmol/L (ref 101–111)
CO2: 29 mmol/L (ref 22–32)
CREATININE: 0.81 mg/dL (ref 0.44–1.00)
GFR calc Af Amer: >60 mL/min (ref >60)
Glucose, Bld: 94 mg/dL (ref 65–99)
Potassium: 4.3 mmol/L (ref 3.5–5.1)
SODIUM: 139 mmol/L (ref 135–145)

## 2015-04-06 ENCOUNTER — Ambulatory Visit (HOSPITAL_BASED_OUTPATIENT_CLINIC_OR_DEPARTMENT_OTHER)
Admission: RE | Admit: 2015-04-06 | Discharge: 2015-04-06 | Disposition: A | Discharge status: Home / Self Care | Payer: Medicare Other | Source: Ambulatory Visit | Attending: Orthopedic Surgery | Admitting: Orthopedic Surgery

## 2015-04-06 ENCOUNTER — Ambulatory Visit (HOSPITAL_BASED_OUTPATIENT_CLINIC_OR_DEPARTMENT_OTHER): Payer: Medicare Other | Admitting: Anesthesiology

## 2015-04-06 ENCOUNTER — Encounter (HOSPITAL_BASED_OUTPATIENT_CLINIC_OR_DEPARTMENT_OTHER)
Admission: RE | Disposition: A | Discharge status: Home / Self Care | Payer: Self-pay | Source: Ambulatory Visit | Attending: Orthopedic Surgery

## 2015-04-06 ENCOUNTER — Encounter (HOSPITAL_BASED_OUTPATIENT_CLINIC_OR_DEPARTMENT_OTHER): Payer: Self-pay

## 2015-04-06 DIAGNOSIS — E785 Hyperlipidemia, unspecified: Secondary | ICD-10-CM | POA: Insufficient documentation

## 2015-04-06 DIAGNOSIS — M65342 Trigger finger, left ring finger: Secondary | ICD-10-CM | POA: Diagnosis not present

## 2015-04-06 DIAGNOSIS — Z87891 Personal history of nicotine dependence: Secondary | ICD-10-CM | POA: Insufficient documentation

## 2015-04-06 DIAGNOSIS — M65842 Other synovitis and tenosynovitis, left hand: Secondary | ICD-10-CM | POA: Diagnosis not present

## 2015-04-06 DIAGNOSIS — M65332 Trigger finger, left middle finger: Secondary | ICD-10-CM | POA: Insufficient documentation

## 2015-04-06 HISTORY — DX: Hyperlipidemia, unspecified: E78.5

## 2015-04-06 HISTORY — PX: TRIGGER FINGER RELEASE: SHX641

## 2015-04-06 SURGERY — RELEASE, A1 PULLEY, FOR TRIGGER FINGER
Anesthesia: Monitor Anesthesia Care | Site: Finger | Laterality: Left

## 2015-04-06 MED ORDER — MIDAZOLAM HCL 2 MG/2ML IJ SOLN
INTRAMUSCULAR | Status: AC
  Filled 2015-04-06: qty 4

## 2015-04-06 MED ORDER — PROPOFOL INFUSION 10 MG/ML OPTIME
INTRAVENOUS | Status: DC | PRN
  Administered 2015-04-06: 100 ug/kg/min via INTRAVENOUS

## 2015-04-06 MED ORDER — FENTANYL CITRATE (PF) 100 MCG/2ML IJ SOLN
INTRAMUSCULAR | Status: DC | PRN
  Administered 2015-04-06 (×2): 50 ug via INTRAVENOUS

## 2015-04-06 MED ORDER — LIDOCAINE HCL (PF) 0.5 % IJ SOLN
INTRAMUSCULAR | Status: AC
  Filled 2015-04-06: qty 50

## 2015-04-06 MED ORDER — HYDROCODONE-ACETAMINOPHEN 5-325 MG PO TABS: ORAL_TABLET | ORAL | Status: DC

## 2015-04-06 MED ORDER — LACTATED RINGERS IV SOLN
INTRAVENOUS | Status: DC
  Administered 2015-04-06: 08:00:00 via INTRAVENOUS

## 2015-04-06 MED ORDER — ONDANSETRON HCL 4 MG/2ML IJ SOLN
INTRAMUSCULAR | Status: DC | PRN
  Administered 2015-04-06: 4 mg via INTRAVENOUS

## 2015-04-06 MED ORDER — BUPIVACAINE HCL (PF) 0.25 % IJ SOLN
INTRAMUSCULAR | Status: AC
  Filled 2015-04-06: qty 30

## 2015-04-06 MED ORDER — PROPOFOL 500 MG/50ML IV EMUL
INTRAVENOUS | Status: AC
  Filled 2015-04-06: qty 50

## 2015-04-06 MED ORDER — GLYCOPYRROLATE 0.2 MG/ML IJ SOLN: 0.2000 mg | Freq: Once | INTRAMUSCULAR | Status: DC | PRN

## 2015-04-06 MED ORDER — CHLORHEXIDINE GLUCONATE 4 % EX LIQD: 60.0000 mL | Freq: Once | CUTANEOUS | Status: DC

## 2015-04-06 MED ORDER — BUPIVACAINE HCL (PF) 0.25 % IJ SOLN
INTRAMUSCULAR | Status: DC | PRN
  Administered 2015-04-06: 10 mL

## 2015-04-06 MED ORDER — CEFAZOLIN SODIUM-DEXTROSE 2-3 GM-% IV SOLR
INTRAVENOUS | Status: AC
  Filled 2015-04-06: qty 50

## 2015-04-06 MED ORDER — LIDOCAINE HCL (PF) 0.5 % IJ SOLN
INTRAMUSCULAR | Status: DC | PRN
  Administered 2015-04-06: 35 mL via INTRAVENOUS

## 2015-04-06 MED ORDER — DEXAMETHASONE SODIUM PHOSPHATE 10 MG/ML IJ SOLN
INTRAMUSCULAR | Status: AC
  Filled 2015-04-06: qty 1

## 2015-04-06 MED ORDER — ONDANSETRON HCL 4 MG/2ML IJ SOLN
INTRAMUSCULAR | Status: AC
  Filled 2015-04-06: qty 2

## 2015-04-06 MED ORDER — FENTANYL CITRATE (PF) 100 MCG/2ML IJ SOLN
INTRAMUSCULAR | Status: AC
  Filled 2015-04-06: qty 4

## 2015-04-06 MED ORDER — LIDOCAINE HCL (CARDIAC) 20 MG/ML IV SOLN
INTRAVENOUS | Status: AC
  Filled 2015-04-06: qty 5

## 2015-04-06 MED ORDER — KETOROLAC TROMETHAMINE 30 MG/ML IJ SOLN
INTRAMUSCULAR | Status: DC | PRN
  Administered 2015-04-06: 30 mg via INTRAVENOUS

## 2015-04-06 MED ORDER — MIDAZOLAM HCL 5 MG/5ML IJ SOLN
INTRAMUSCULAR | Status: DC | PRN
  Administered 2015-04-06: 1 mg via INTRAVENOUS

## 2015-04-06 MED ORDER — SCOPOLAMINE 1 MG/3DAYS TD PT72: 1.0000 | MEDICATED_PATCH | Freq: Once | TRANSDERMAL | Status: DC | PRN

## 2015-04-06 MED ORDER — LIDOCAINE HCL (CARDIAC) 20 MG/ML IV SOLN
INTRAVENOUS | Status: DC | PRN
  Administered 2015-04-06: 50 mg via INTRAVENOUS

## 2015-04-06 MED ORDER — CEFAZOLIN SODIUM-DEXTROSE 2-3 GM-% IV SOLR
2.0000 g | INTRAVENOUS | Status: AC
  Administered 2015-04-06: 2 g via INTRAVENOUS

## 2015-04-06 SURGICAL SUPPLY — 37 items
BANDAGE COBAN STERILE 2 (GAUZE/BANDAGES/DRESSINGS) ×3 IMPLANT
BLADE MINI RND TIP GREEN BEAV (BLADE) IMPLANT
BLADE SURG 15 STRL LF DISP TIS (BLADE) ×2 IMPLANT
BLADE SURG 15 STRL SS (BLADE) ×6
BNDG CMPR 9X4 STRL LF SNTH (GAUZE/BANDAGES/DRESSINGS)
BNDG CONFORM 2 STRL LF (GAUZE/BANDAGES/DRESSINGS) ×3 IMPLANT
BNDG ESMARK 4X9 LF (GAUZE/BANDAGES/DRESSINGS) IMPLANT
CHLORAPREP W/TINT 26ML (MISCELLANEOUS) ×3 IMPLANT
CORDS BIPOLAR (ELECTRODE) ×3 IMPLANT
COVER BACK TABLE 60X90IN (DRAPES) ×3 IMPLANT
COVER MAYO STAND STRL (DRAPES) ×3 IMPLANT
CUFF TOURNIQUET SINGLE 18IN (TOURNIQUET CUFF) ×3 IMPLANT
DRAPE EXTREMITY T 121X128X90 (DRAPE) ×3 IMPLANT
DRAPE SURG 17X23 STRL (DRAPES) ×3 IMPLANT
GAUZE SPONGE 4X4 12PLY STRL (GAUZE/BANDAGES/DRESSINGS) ×3 IMPLANT
GAUZE XEROFORM 1X8 LF (GAUZE/BANDAGES/DRESSINGS) ×3 IMPLANT
GLOVE BIO SURGEON STRL SZ7.5 (GLOVE) ×3 IMPLANT
GLOVE BIOGEL PI IND STRL 7.0 (GLOVE) IMPLANT
GLOVE BIOGEL PI IND STRL 8 (GLOVE) ×1 IMPLANT
GLOVE BIOGEL PI INDICATOR 7.0 (GLOVE) ×2
GLOVE BIOGEL PI INDICATOR 8 (GLOVE) ×2
GLOVE ECLIPSE 6.5 STRL STRAW (GLOVE) ×2 IMPLANT
GLOVE EXAM NITRILE MD LF STRL (GLOVE) ×2 IMPLANT
GOWN STRL REUS W/ TWL LRG LVL3 (GOWN DISPOSABLE) ×1 IMPLANT
GOWN STRL REUS W/TWL LRG LVL3 (GOWN DISPOSABLE) ×3
NDL HYPO 25X1 1.5 SAFETY (NEEDLE) IMPLANT
NEEDLE HYPO 25X1 1.5 SAFETY (NEEDLE) ×3 IMPLANT
NS IRRIG 1000ML POUR BTL (IV SOLUTION) ×3 IMPLANT
PACK BASIN DAY SURGERY FS (CUSTOM PROCEDURE TRAY) ×3 IMPLANT
PADDING CAST ABS 4INX4YD NS (CAST SUPPLIES)
PADDING CAST ABS COTTON 4X4 ST (CAST SUPPLIES) ×1 IMPLANT
STOCKINETTE 4X48 STRL (DRAPES) ×3 IMPLANT
SUT ETHILON 4 0 PS 2 18 (SUTURE) ×3 IMPLANT
SYR BULB 3OZ (MISCELLANEOUS) ×3 IMPLANT
SYR CONTROL 10ML LL (SYRINGE) ×2 IMPLANT
TOWEL OR 17X24 6PK STRL BLUE (TOWEL DISPOSABLE) ×6 IMPLANT
UNDERPAD 30X30 (UNDERPADS AND DIAPERS) ×3 IMPLANT

## 2015-04-06 NOTE — Op Note (Signed)
[id number] 

## 2015-04-06 NOTE — H&P (Signed)
Jean Hudson is an 64 y.o. female.   Chief Complaint: left long and ring finger trigger digits HPI: 64 yo female with triggering of left long and ring fingers.  This is bothersome to her.  Ring finger is painful.  These have been injected with recurrence.  She wishes to have trigger releases.  Past Medical History  Diagnosis Date  . Asthma   . Seasonal allergies   . Rotator cuff tear, right 08/2011  . SLAP lesion of shoulder 08/2011    right  . Hyperlipidemia     Past Surgical History  Procedure Laterality Date  . Dorsal compartment release  12/12/2005    right 1st dorsal compartment  . Trigger finger release  07/23/2011    Procedure: RELEASE TRIGGER FINGER/A-1 PULLEY;  Surgeon: Cammie Sickle., MD;  Location: [facility];  Service: Orthopedics;  Laterality: Left;  left thumb  . Dorsal compartment release  07/23/2011    Procedure: RELEASE DORSAL COMPARTMENT (DEQUERVAIN);  Surgeon: Cammie Sickle., MD;  Location: [facility];  Service: Orthopedics;  Laterality: Left;  1st left dorsal compartment  . Back surgery  1988  . Shoulder arthroscopy  2/13    rt rcr  . Tubal ligation    . Colonoscopy    . Shoulder arthroscopy with rotator cuff repair and subacromial decompression  06/02/2012    Procedure: SHOULDER ARTHROSCOPY WITH ROTATOR CUFF REPAIR AND SUBACROMIAL DECOMPRESSION;  Surgeon: Cammie Sickle., MD;  Location: [facility];  Service: Orthopedics;  Laterality: Left;  LEFT SHOULDER ARTHROSCOPY WITH SUBACROMIAL DECOMPRESSION, DISTAL CLAVICLE RESECTION AND REPAIR ROTATOR CUFF    Family History  Problem Relation Age of Onset  . Anesthesia problems Father     very agitated waking up post-op   Social History:  reports that she quit smoking about 23 years ago. She has never used smokeless tobacco. She reports that she does not drink alcohol or use illicit drugs.  Allergies:  Allergies  Allergen Reactions  . Tramadol Other (See  Comments)    "makes me dizzy"  . Demerol Nausea And Vomiting  . Flagyl [Metronidazole Hcl] Swelling and Rash    JOINT SWELLING  . Hydrocodone Itching    Medications Prior to Admission  Medication Sig Dispense Refill  . albuterol (PROVENTIL) (2.5 MG/3ML) 0.083% nebulizer solution Take 2.5 mg by nebulization every 6 (six) hours as needed for wheezing or shortness of breath.    Marland Kitchen atorvastatin (LIPITOR) 10 MG tablet Take 1 tablet (10 mg total) by mouth daily. 30 tablet 6  . cloNIDine (CATAPRES) 0.1 MG tablet Take 0.1 mg by mouth 2 (two) times daily.     . fluticasone-salmeterol (ADVAIR HFA) 230-21 MCG/ACT inhaler Inhale 2 puffs into the lungs daily.     . meloxicam (MOBIC) 15 MG tablet Take 15 mg by mouth daily.    . montelukast (SINGULAIR) 10 MG tablet Take 10 mg by mouth at bedtime.     Marland Kitchen albuterol (PROVENTIL HFA;VENTOLIN HFA) 108 (90 BASE) MCG/ACT inhaler Inhale 2 puffs into the lungs every 4 (four) hours as needed for wheezing or shortness of breath.      Results for orders placed or performed during the hospital encounter of 04/06/15 (from the past 48 hour(s))  Basic metabolic panel     Status: None   Collection Time: 04/05/15  1:05 PM  Result Value Ref Range   Sodium 139 135 - 145 mmol/L   Potassium 4.3 3.5 - 5.1 mmol/L  Chloride 102 101 - 111 mmol/L   CO2 29 22 - 32 mmol/L   Glucose, Bld 94 65 - 99 mg/dL   BUN 10 6 - 20 mg/dL   Creatinine, Ser 0.81 0.44 - 1.00 mg/dL   Calcium 9.6 8.9 - 10.3 mg/dL   GFR calc non Af Amer >60 >60 mL/min   GFR calc Af Amer >60 >60 mL/min    Comment: (NOTE) The eGFR has been calculated using the CKD EPI equation. This calculation has not been validated in all clinical situations. eGFR's persistently <60 mL/min signify possible Chronic Kidney Disease.    Anion gap 8 5 - 15    No results found.   A comprehensive review of systems was negative except for: Eyes: positive for contacts/glasses Respiratory: positive for asthma  Blood pressure  147/60, pulse 59, temperature 98.2 F (36.8 C), temperature source Oral, resp. rate 16, height 5' 5" (1.651 m), weight 67.246 kg (148 lb 4 oz), SpO2 98 %.  General appearance: alert, cooperative and appears stated age Head: Normocephalic, without obvious abnormality, atraumatic Neck: supple, symmetrical, trachea midline Resp: clear to auscultation bilaterally Cardio: regular rate and rhythm GI: non tender Extremities: intact sensation and capillary refill all digits.  +epl/fpl/io.  no wounds. Pulses: 2+ and symmetric Skin: Skin color, texture, turgor normal. No rashes or lesions Neurologic: Grossly normal Incision/Wound: none  Assessment/Plan Left long and ring finger trigger digits.  Non operative and operative treatment options were discussed with the patient and patient wishes to proceed with operative treatment. Risks, benefits, and alternatives of surgery were discussed and the patient agrees with the plan of care.   KUZMA,KEVIN R 04/06/2015, 8:25 AM     

## 2015-04-06 NOTE — Anesthesia Procedure Notes (Signed)
Procedure Name: MAC Performed by: PEARSON, DONNA W Pre-anesthesia Checklist: Patient identified, Timeout performed, Emergency Drugs available, Suction available and Patient being monitored Patient Re-evaluated:Patient Re-evaluated prior to induction Oxygen Delivery Method: Simple face mask       

## 2015-04-06 NOTE — Brief Op Note (Signed)
04/06/2015  9:14 AM  PATIENT:  Dolores Hoose  64 y.o. 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  PROCEDURE:  Procedure(s): RELEASE A-1 PULLEY LEFT LONG AND LEFT RING FINGERS (Left)  SURGEON:  Surgeon(s) and Role:    * Leanora Cover, MD - Primary  PHYSICIAN ASSISTANT:   ASSISTANTS: none   ANESTHESIA:   Bier block with sedation  EBL:  Total I/O In: 300 [I.V.:300] Out: -   BLOOD ADMINISTERED:none  DRAINS: none   LOCAL MEDICATIONS USED:  MARCAINE     SPECIMEN:  No Specimen  DISPOSITION OF SPECIMEN:  N/A  COUNTS:  YES  TOURNIQUET:   Total Tourniquet Time Documented: Forearm (Left) - 23 minutes Total: Forearm (Left) - 23 minutes   DICTATION: .Other Dictation: Dictation Number [id number]  PLAN OF CARE: Discharge to home after PACU  PATIENT DISPOSITION:  PACU - hemodynamically stable.

## 2015-04-06 NOTE — Op Note (Signed)
NAMEGEORGIANN, NEIDER               ACCOUNT NO.:  [id number]  MEDICAL RECORD NO.:  [id number]  LOCATION:                                 FACILITY:  PHYSICIAN:  Leanora Cover, MD        DATE OF BIRTH:  [birth year]  DATE OF PROCEDURE:  04/06/2015 DATE OF DISCHARGE:                              OPERATIVE REPORT   PREOPERATIVE DIAGNOSIS:  Left long and ring finger trigger digits.  POSTOPERATIVE DIAGNOSIS:  Left long and ring finger trigger digits.  PROCEDURE:   1. Left long finger trigger release. 2. Left ring finger trigger release.  SURGEON:  Leanora Cover, MD  ASSISTANT:  None.  ANESTHESIA:  Bier-block with sedation.  IV FLUIDS:  Per anesthesia flow sheet.  ESTIMATED BLOOD LOSS:  Minimal.  COMPLICATIONS:  None.  SPECIMENS:  None.  TOURNIQUET TIME:  23 minutes.  DISPOSITION:  Stable to PACU.  INDICATIONS:  Jean Hudson is a 64 year old female who has had triggering in the left, long, and ring fingers.  These have been injected with recurrence.  She wishes to have a trigger release.  Risks, benefits, and alternatives of surgery were discussed including risk of blood loss; infection; damage to nerves, vessels, tendons, ligaments, bone; failure of surgery; need for additional surgery; complications with wound healing, continued pain, and recurrence of triggering.  She voiced understanding of these risks and elected to proceed.  OPERATIVE COURSE:  After being identified preoperatively by myself, the patient and I agreed upon procedure and site of procedure.  Surgical site was marked.  Risks, benefits, and alternatives of surgery were reviewed, and she wished to proceed.  Surgical consent had been signed. She was given IV Ancef as preoperative antibiotic prophylaxis.  She was transported to the operating room and placed on the operating room table in a supine position with the left upper extremity on arm board.  Bier block anesthesia was induced by the anesthesiologist.  Left  upper extremity was prepped and draped in normal sterile orthopedic fashion. Surgical pause was performed between surgeons, anesthesia, and operating room staff, and all were in agreement as to the patient, procedure, and site of procedure.  Tourniquet at the proximal aspect of the forearm had been inflated with a Bier block.  Incision was made over the long finger MP joint.  This was carried into subcutaneous tissues by spreading technique.  Bipolar electrocautery was used to obtain hemostasis.  The A1 pulley was identified and sharply incised.  The tendons were brought through the wound and separated.  There was no recurrence of triggering. An incision was then made over the ring finger MP joint.  This was carried into subcutaneous tissues by spreading technique.  The A1 pulley was identified and sharply incised.  The tendons were brought through the wound and separated.  Again, there was no evidence of triggering. Neurovascular bundles were protected throughout the case.  The wounds were copiously irrigated with sterile saline.  They were closed with 4-0 nylon in a horizontal mattress fashion.  They were injected with 10 mL of 0.25% plain Marcaine to aid in postoperative analgesia.  They were then dressed with sterile Xeroform, 4 x 4s, and wrapped  with a Kling and Coban dressing lightly.  Tourniquet was deflated at 23 minutes.  The fingertips were pink with brisk capillary refill after deflation of the tourniquet.  Operative drapes were broken down and the patient was awoken from anesthesia safely.  She was transferred back to stretcher and taken to PACU in stable condition.  I will see her back in the office 1 week for postoperative followup.  We will give her Norco 5/325 one to two p.o. q.6 hours p.r.n. pain, dispensed #30.     Leanora Cover, MD     KK/MEDQ  D:  04/06/2015  T:  04/06/2015  Job:  [id number]

## 2015-04-06 NOTE — Transfer of Care (Signed)
Immediate Anesthesia Transfer of Care Note  Patient: Jean Hudson  Procedure(s) Performed: Procedure(s): RELEASE A-1 PULLEY LEFT LONG AND LEFT RING FINGERS (Left)  Patient Location: PACU  Anesthesia Type:MAC and Bier block  Level of Consciousness: awake, alert  and oriented  Airway & Oxygen Therapy: Patient Spontanous Breathing and Patient connected to face mask oxygen  Post-op Assessment: Report given to RN and Post -op Vital signs reviewed and stable  Post vital signs: Reviewed and stable  Last Vitals:  Filed Vitals:   04/06/15 0759  BP: 147/60  Pulse: 59  Temp: 36.8 C  Resp: 16    Complications: No apparent anesthesia complications

## 2015-04-06 NOTE — Discharge Instructions (Addendum)

## 2015-04-06 NOTE — Anesthesia Preprocedure Evaluation (Addendum)
Anesthesia Evaluation  Patient identified by MRN, date of birth, ID band Patient awake    Reviewed: Allergy & Precautions, NPO status , Patient's Chart, lab work & pertinent test results  Airway Mallampati: II  TM Distance: >3 FB Neck ROM: Full    Dental   Pulmonary asthma , former smoker,    breath sounds clear to auscultation       Cardiovascular negative cardio ROS   Rhythm:Regular Rate:Normal     Neuro/Psych negative neurological ROS     GI/Hepatic negative GI ROS, Neg liver ROS,   Endo/Other  negative endocrine ROS  Renal/GU negative Renal ROS     Musculoskeletal   Abdominal   Peds  Hematology negative hematology ROS (+)   Anesthesia Other Findings   Reproductive/Obstetrics                            Anesthesia Physical Anesthesia Plan  ASA: II  Anesthesia Plan: MAC and Bier Block   Post-op Pain Management:    Induction: Intravenous  Airway Management Planned: Natural Airway and Simple Face Mask  Additional Equipment:   Intra-op Plan:   Post-operative Plan:   Informed Consent: I have reviewed the patients History and Physical, chart, labs and discussed the procedure including the risks, benefits and alternatives for the proposed anesthesia with the patient or authorized representative who has indicated his/her understanding and acceptance.     Plan Discussed with: CRNA  Anesthesia Plan Comments:        Anesthesia Quick Evaluation

## 2015-04-06 NOTE — Anesthesia Postprocedure Evaluation (Signed)
  Anesthesia Post-op Note  Patient: Jean Hudson  Procedure(s) Performed: Procedure(s): RELEASE A-1 PULLEY LEFT LONG AND LEFT RING FINGERS (Left)  Patient Location: PACU  Anesthesia Type:MAC and Bier block  Level of Consciousness: awake and alert   Airway and Oxygen Therapy: Patient Spontanous Breathing  Post-op Pain: none  Post-op Assessment: Post-op Vital signs reviewed              Post-op Vital Signs: Reviewed  Last Vitals:  Filed Vitals:   04/06/15 1000  BP: 142/63  Pulse: 60  Temp: 36.4 C  Resp: 18    Complications: No apparent anesthesia complications

## 2015-04-07 ENCOUNTER — Encounter (HOSPITAL_BASED_OUTPATIENT_CLINIC_OR_DEPARTMENT_OTHER): Payer: Self-pay | Admitting: Orthopedic Surgery

## 2015-04-12 ENCOUNTER — Other Ambulatory Visit: Payer: Self-pay | Admitting: *Deleted

## 2015-04-12 DIAGNOSIS — E782 Mixed hyperlipidemia: Secondary | ICD-10-CM

## 2015-04-12 MED ORDER — ATORVASTATIN CALCIUM 10 MG PO TABS: 10.0000 mg | ORAL_TABLET | Freq: Every day | ORAL | Status: DC

## 2015-05-09 DIAGNOSIS — Z23 Encounter for immunization: Secondary | ICD-10-CM | POA: Diagnosis not present

## 2015-05-12 DIAGNOSIS — M7062 Trochanteric bursitis, left hip: Secondary | ICD-10-CM | POA: Diagnosis not present

## 2015-05-15 DIAGNOSIS — R030 Elevated blood-pressure reading, without diagnosis of hypertension: Secondary | ICD-10-CM | POA: Diagnosis not present

## 2015-05-22 ENCOUNTER — Other Ambulatory Visit: Payer: Self-pay | Admitting: Interventional Cardiology

## 2015-05-22 DIAGNOSIS — M7062 Trochanteric bursitis, left hip: Secondary | ICD-10-CM | POA: Diagnosis not present

## 2015-05-24 DIAGNOSIS — Z85828 Personal history of other malignant neoplasm of skin: Secondary | ICD-10-CM | POA: Diagnosis not present

## 2015-05-24 DIAGNOSIS — Z808 Family history of malignant neoplasm of other organs or systems: Secondary | ICD-10-CM | POA: Diagnosis not present

## 2015-05-24 DIAGNOSIS — I10 Essential (primary) hypertension: Secondary | ICD-10-CM | POA: Diagnosis not present

## 2015-05-24 DIAGNOSIS — D2272 Melanocytic nevi of left lower limb, including hip: Secondary | ICD-10-CM | POA: Diagnosis not present

## 2015-05-24 DIAGNOSIS — L821 Other seborrheic keratosis: Secondary | ICD-10-CM | POA: Diagnosis not present

## 2015-05-24 DIAGNOSIS — L905 Scar conditions and fibrosis of skin: Secondary | ICD-10-CM | POA: Diagnosis not present

## 2015-05-29 DIAGNOSIS — M7062 Trochanteric bursitis, left hip: Secondary | ICD-10-CM | POA: Diagnosis not present

## 2015-06-01 DIAGNOSIS — M7062 Trochanteric bursitis, left hip: Secondary | ICD-10-CM | POA: Diagnosis not present

## 2015-06-06 DIAGNOSIS — M7062 Trochanteric bursitis, left hip: Secondary | ICD-10-CM | POA: Diagnosis not present

## 2015-06-12 DIAGNOSIS — M7062 Trochanteric bursitis, left hip: Secondary | ICD-10-CM | POA: Diagnosis not present

## 2015-06-15 DIAGNOSIS — M7062 Trochanteric bursitis, left hip: Secondary | ICD-10-CM | POA: Diagnosis not present

## 2015-06-21 DIAGNOSIS — M7062 Trochanteric bursitis, left hip: Secondary | ICD-10-CM | POA: Diagnosis not present

## 2015-06-23 DIAGNOSIS — M7062 Trochanteric bursitis, left hip: Secondary | ICD-10-CM | POA: Diagnosis not present

## 2015-06-23 DIAGNOSIS — M1612 Unilateral primary osteoarthritis, left hip: Secondary | ICD-10-CM | POA: Diagnosis not present

## 2015-06-27 DIAGNOSIS — Z1231 Encounter for screening mammogram for malignant neoplasm of breast: Secondary | ICD-10-CM | POA: Diagnosis not present

## 2015-06-29 DIAGNOSIS — M1612 Unilateral primary osteoarthritis, left hip: Secondary | ICD-10-CM | POA: Diagnosis not present

## 2015-07-11 DIAGNOSIS — M1612 Unilateral primary osteoarthritis, left hip: Secondary | ICD-10-CM | POA: Diagnosis not present

## 2015-07-11 DIAGNOSIS — M87052 Idiopathic aseptic necrosis of left femur: Secondary | ICD-10-CM | POA: Diagnosis not present

## 2015-07-11 DIAGNOSIS — M7062 Trochanteric bursitis, left hip: Secondary | ICD-10-CM | POA: Diagnosis not present

## 2015-07-25 DIAGNOSIS — M1612 Unilateral primary osteoarthritis, left hip: Secondary | ICD-10-CM | POA: Diagnosis not present

## 2015-07-26 DIAGNOSIS — M1612 Unilateral primary osteoarthritis, left hip: Secondary | ICD-10-CM | POA: Diagnosis not present

## 2015-08-08 DIAGNOSIS — M25512 Pain in left shoulder: Secondary | ICD-10-CM | POA: Diagnosis not present

## 2015-08-10 ENCOUNTER — Telehealth: Payer: Self-pay | Admitting: *Deleted

## 2015-08-10 ENCOUNTER — Encounter: Payer: Self-pay | Admitting: Cardiology

## 2015-08-10 ENCOUNTER — Ambulatory Visit (INDEPENDENT_AMBULATORY_CARE_PROVIDER_SITE_OTHER): Payer: Medicare Other | Admitting: Cardiology

## 2015-08-10 VITALS — BP 142/94 | HR 60 | Ht 65.0 in | Wt 149.1 lb

## 2015-08-10 DIAGNOSIS — Z01818 Encounter for other preprocedural examination: Secondary | ICD-10-CM

## 2015-08-10 DIAGNOSIS — R0789 Other chest pain: Secondary | ICD-10-CM

## 2015-08-10 DIAGNOSIS — E785 Hyperlipidemia, unspecified: Secondary | ICD-10-CM | POA: Diagnosis not present

## 2015-08-10 DIAGNOSIS — Z8249 Family history of ischemic heart disease and other diseases of the circulatory system: Secondary | ICD-10-CM

## 2015-08-10 DIAGNOSIS — Z87891 Personal history of nicotine dependence: Secondary | ICD-10-CM

## 2015-08-10 NOTE — Progress Notes (Signed)
08/10/2015 Jean Hudson   1950-08-07  [id number]  Primary Physician Gennette Pac, MD Primary Cardiologist: Dr. Irish Lack   Reason for Visit/CC: Cardiac Clearance for Hip Surgery  HPI:  65 y/o female, seen in the past by Dr. Irish Lack, who presents to clinic for surgical clearance. She is needing to undergo left hip replacement in the near future.  She was first referred to Dr. Irish Lack 04/2014 for CP evaluation. He felt that her symptoms were atypical for cardiac pain, however given her multiple risk factors for CAD including a strong family h/o CAD, prior 20 year h/o tobacco use and personal h/o HLD, he ordered for her to undergo an ETT for risk stratification. However, she failed to get this done. She now needs clearance for surgery. She does not feel that she would be able to walk on the treadmill long enough to reach target HR given her severe hip OA.   She does note 2 episodes of substernal chest tightness in the last 2 months, that occurred at rest and was relieved with 2 baby ASA. She feels that her symptoms were related to her hip pain + anxiety. She denies any significant dyspnea, syncope/ near syncope. Resting EKG today shows NSR. HR 60 bpm.     Current Outpatient Prescriptions  Medication Sig Dispense Refill  . albuterol (PROVENTIL HFA;VENTOLIN HFA) 108 (90 BASE) MCG/ACT inhaler Inhale 2 puffs into the lungs every 4 (four) hours as needed for wheezing or shortness of breath. Reported on 08/10/2015    . atorvastatin (LIPITOR) 10 MG tablet TAKE 1 TABLET BY MOUTH DAILY 30 tablet 0  . cloNIDine (CATAPRES) 0.1 MG tablet Take 0.1 mg by mouth 2 (two) times daily.     . cyclobenzaprine (FLEXERIL) 10 MG tablet Take 10 mg by mouth 3 (three) times daily as needed. (muscle spasms)  0  . fluticasone-salmeterol (ADVAIR HFA) 230-21 MCG/ACT inhaler Inhale 2 puffs into the lungs daily.     . meloxicam (MOBIC) 15 MG tablet Take 15 mg by mouth daily.    . montelukast (SINGULAIR) 10 MG  tablet Take 10 mg by mouth at bedtime.      No current facility-administered medications for this visit.    Allergies  Allergen Reactions  . Tramadol Other (See Comments)    "makes me dizzy"  . Demerol Nausea And Vomiting  . Flagyl [Metronidazole Hcl] Swelling and Rash    JOINT SWELLING  . Hydrocodone Itching    Social History   Social History  . Marital Status: Married    Spouse Name: N/A  . Number of Children: N/A  . Years of Education: N/A   Occupational History  . Not on file.   Social History Main Topics  . Smoking status: Former Smoker    Quit date: 07/16/1991  . Smokeless tobacco: Never Used  . Alcohol Use: No  . Drug Use: No  . Sexual Activity: Not on file   Other Topics Concern  . Not on file   Social History Narrative     Review of Systems: General: negative for chills, fever, night sweats or weight changes.  Cardiovascular: negative for chest pain, dyspnea on exertion, edema, orthopnea, palpitations, paroxysmal nocturnal dyspnea or shortness of breath Dermatological: negative for rash Respiratory: negative for cough or wheezing Urologic: negative for hematuria Abdominal: negative for nausea, vomiting, diarrhea, bright red blood per rectum, melena, or hematemesis Neurologic: negative for visual changes, syncope, or dizziness All other systems reviewed and are otherwise negative except as noted above.  Blood pressure 142/94, pulse 60, height 5\' 5"  (1.651 m), weight 149 lb 1.9 oz (67.64 kg).  General appearance: alert, cooperative and no distress Neck: no carotid bruit and no JVD Lungs: clear to auscultation bilaterally Heart: regular rate and rhythm, S1, S2 normal, no murmur, click, rub or gallop Extremities: no LEE Pulses: 2+ and symmetric Skin: warm and dry Neurologic: Grossly normal  EKG NSR. HR 60 bpm.   ASSESSMENT AND PLAN:   1. Surgical Clearance: patient initially seen by Dr. Irish Lack 04/2014, at which time he ordered for her to  undergo an ETT, given complaints of CP in the setting of risk factors, which include strong family h/o CAD, 25 year h/o tobacco use and personal history of HTN. This was never completed, thus cannot r/o ischemia. Given upcomming surgery and 2 episodes of chest tightness in the last 2 months, we will plan to reschedule stress test. We will opt for a Lexiscan (chemical) stress test, as her severe hip OSA may prohibit desired level of participation to reach target HR. If negative for ischemia, she can be cleared for surgery. We will f/u on results.   PLAN  Lexiscan NST to r/o ischemia, prior to surgery.   Lyda Jester PA-C 08/10/2015 2:52 PM

## 2015-08-10 NOTE — Patient Instructions (Signed)
Your physician recommends that you continue on your current medications as directed. Please refer to the Current Medication list given to you today. Your physician has requested that you have a lexiscan myoview. For further information please visit [email or web address]. Please follow instruction sheet, as given.  Your physician recommends that you schedule a follow-up appointment with Dr. Irish Lack as needed.

## 2015-08-10 NOTE — Telephone Encounter (Signed)
Placed surgical clearance form in nurse fax bin in medical records to be faxed.  Pt "needs stress test.  Will arrange ASAP"--this has been scheduled for 08/26/15.

## 2015-08-14 ENCOUNTER — Telehealth (HOSPITAL_COMMUNITY): Payer: Self-pay | Admitting: *Deleted

## 2015-08-14 NOTE — Telephone Encounter (Signed)
Patient given detailed instructions per Myocardial Perfusion Study Information Sheet for the test on 08/16/15 at 0730. Patient notified to arrive 15 minutes early and that it is imperative to arrive on time for appointment to keep from having the test rescheduled.  If you need to cancel or reschedule your appointment, please call the office within 24 hours of your appointment. Failure to do so may result in a cancellation of your appointment, and a $50 no show fee. Patient verbalized understanding.Isacc Turney, Ranae Palms

## 2015-08-16 ENCOUNTER — Ambulatory Visit (HOSPITAL_COMMUNITY): Payer: Medicare Other | Attending: Cardiovascular Disease

## 2015-08-16 DIAGNOSIS — R51 Headache: Secondary | ICD-10-CM

## 2015-08-16 DIAGNOSIS — R9439 Abnormal result of other cardiovascular function study: Secondary | ICD-10-CM | POA: Insufficient documentation

## 2015-08-16 DIAGNOSIS — I10 Essential (primary) hypertension: Secondary | ICD-10-CM | POA: Diagnosis not present

## 2015-08-16 DIAGNOSIS — Z23 Encounter for immunization: Secondary | ICD-10-CM | POA: Diagnosis not present

## 2015-08-16 DIAGNOSIS — Z8249 Family history of ischemic heart disease and other diseases of the circulatory system: Secondary | ICD-10-CM | POA: Insufficient documentation

## 2015-08-16 DIAGNOSIS — M25552 Pain in left hip: Secondary | ICD-10-CM | POA: Diagnosis not present

## 2015-08-16 DIAGNOSIS — R0789 Other chest pain: Secondary | ICD-10-CM | POA: Insufficient documentation

## 2015-08-16 DIAGNOSIS — Z01818 Encounter for other preprocedural examination: Secondary | ICD-10-CM | POA: Diagnosis not present

## 2015-08-16 DIAGNOSIS — R519 Headache, unspecified: Secondary | ICD-10-CM

## 2015-08-16 LAB — MYOCARDIAL PERFUSION IMAGING
CHL CUP NUCLEAR SSS: 7
CSEPPHR: 93 {beats}/min
LHR: 0.32
LV dias vol: 83 mL
LVSYSVOL: 26 mL
NUC STRESS TID: 1.09
Rest HR: 60 {beats}/min
SDS: 2
SRS: 5

## 2015-08-16 MED ORDER — REGADENOSON 0.4 MG/5ML IV SOLN
0.4000 mg | Freq: Once | INTRAVENOUS | Status: AC
  Administered 2015-08-16: 0.4 mg via INTRAVENOUS

## 2015-08-16 MED ORDER — AMINOPHYLLINE 25 MG/ML IV SOLN
75.0000 mg | Freq: Once | INTRAVENOUS | Status: AC
  Administered 2015-08-16: 75 mg via INTRAVENOUS

## 2015-08-16 MED ORDER — TECHNETIUM TC 99M SESTAMIBI GENERIC - CARDIOLITE
31.3000 | Freq: Once | INTRAVENOUS | Status: AC | PRN
  Administered 2015-08-16: 31.3 via INTRAVENOUS

## 2015-08-16 MED ORDER — TECHNETIUM TC 99M SESTAMIBI GENERIC - CARDIOLITE
10.1000 | Freq: Once | INTRAVENOUS | Status: AC | PRN
  Administered 2015-08-16: 10.1 via INTRAVENOUS

## 2015-08-18 ENCOUNTER — Telehealth: Payer: Self-pay | Admitting: Cardiology

## 2015-08-18 ENCOUNTER — Telehealth: Payer: Self-pay | Admitting: Interventional Cardiology

## 2015-08-18 NOTE — Telephone Encounter (Signed)
New Message  Pt called back to discuss when the test will be read. Because this is pushing her surgery further out. Please call back to discus.

## 2015-08-18 NOTE — Telephone Encounter (Signed)
Calling requesting results of stress test done on 2/1.  States she needs cardiac clearance for hip surgery and is anxious to get the surgery scheduled.  Advised the results have not been reviewed.  Someone will call her once the results have been reviewed. She verbalizes understanding.

## 2015-08-18 NOTE — Telephone Encounter (Signed)
New Messsage  Pt calling to speakw /RN concerning myoview results from 08/16/15. Please call back and discuss.

## 2015-08-22 ENCOUNTER — Encounter: Payer: Self-pay | Admitting: Cardiology

## 2015-08-23 ENCOUNTER — Telehealth: Payer: Self-pay | Admitting: Interventional Cardiology

## 2015-08-23 NOTE — Telephone Encounter (Signed)
New message ° ° ° °Pt calling for rn °

## 2015-08-23 NOTE — Telephone Encounter (Signed)
**Note De-Identified Jean Hudson Obfuscation** The pt has been given her Stress test results and she verbalized understanding.

## 2015-08-24 ENCOUNTER — Ambulatory Visit: Payer: Self-pay | Admitting: Orthopedic Surgery

## 2015-08-27 ENCOUNTER — Encounter: Payer: Self-pay | Admitting: Cardiology

## 2015-08-28 ENCOUNTER — Encounter: Payer: Self-pay | Admitting: Cardiology

## 2015-08-29 ENCOUNTER — Telehealth: Payer: Self-pay | Admitting: Cardiology

## 2015-08-29 NOTE — Telephone Encounter (Signed)
New MEssage  Pt requested to speak w/ [place] concerning recent pt email- concerning headaches from stress test. Please call back and discuss.

## 2015-09-01 DIAGNOSIS — M1612 Unilateral primary osteoarthritis, left hip: Secondary | ICD-10-CM | POA: Diagnosis not present

## 2015-09-04 ENCOUNTER — Ambulatory Visit: Payer: Self-pay | Admitting: Orthopedic Surgery

## 2015-09-04 NOTE — H&P (Signed)
TOTAL HIP ADMISSION H&P  Patient is admitted for left total hip arthroplasty.  Subjective:  Chief Complaint: left hip pain  HPI: Jean Hudson, 65 y.o. female, has a history of pain and functional disability in the left hip(s) due to arthritis and collapsed subchondral fracture and patient has failed non-surgical conservative treatments for greater than 12 weeks to include NSAID's and/or analgesics, flexibility and strengthening excercises, use of assistive devices, weight reduction as appropriate and activity modification.  Onset of symptoms was abrupt starting 1 years ago with rapidlly worsening course since that time.The patient noted no past surgery on the left hip(s).  Patient currently rates pain in the left hip at 10 out of 10 with activity. Patient has night pain, worsening of pain with activity and weight bearing, pain that interfers with activities of daily living and pain with passive range of motion. Patient has evidence of subchondral sclerosis, joint space narrowing and subchondral fracture (MRI) by imaging studies. This condition presents safety issues increasing the risk of falls.  There is no current active infection.  Patient Active Problem List   Diagnosis Date Noted  . HLD (hyperlipidemia) 08/10/2015  . Former smoker, stopped smoking in distant past 08/10/2015  . Family history of heart disease 04/19/2014  . Pain in shoulder 04/19/2014   Past Medical History  Diagnosis Date  . Asthma   . Seasonal allergies   . Rotator cuff tear, right 08/2011  . SLAP lesion of shoulder 08/2011    right  . Hyperlipidemia     Past Surgical History  Procedure Laterality Date  . Dorsal compartment release  12/12/2005    right 1st dorsal compartment  . Trigger finger release  07/23/2011    Procedure: RELEASE TRIGGER FINGER/A-1 PULLEY;  Surgeon: Cammie Sickle., MD;  Location: [place];  Service: Orthopedics;  Laterality: Left;  left thumb  . Dorsal compartment release   07/23/2011    Procedure: RELEASE DORSAL COMPARTMENT (DEQUERVAIN);  Surgeon: Cammie Sickle., MD;  Location: [facility];  Service: Orthopedics;  Laterality: Left;  1st left dorsal compartment  . Back surgery  1988  . Shoulder arthroscopy  2/13    rt rcr  . Tubal ligation    . Colonoscopy    . Shoulder arthroscopy with rotator cuff repair and subacromial decompression  06/02/2012    Procedure: SHOULDER ARTHROSCOPY WITH ROTATOR CUFF REPAIR AND SUBACROMIAL DECOMPRESSION;  Surgeon: Cammie Sickle., MD;  Location: [place];  Service: Orthopedics;  Laterality: Left;  LEFT SHOULDER ARTHROSCOPY WITH SUBACROMIAL DECOMPRESSION, DISTAL CLAVICLE RESECTION AND REPAIR ROTATOR CUFF  . Trigger finger release Left 04/06/2015    Procedure: RELEASE A-1 PULLEY LEFT LONG AND LEFT RING FINGERS;  Surgeon: Leanora Cover, MD;  Location: [place];  Service: Orthopedics;  Laterality: Left;     (Not in a hospital admission) Allergies  Allergen Reactions  . Tramadol Other (See Comments)    "makes me dizzy"  . Demerol Nausea And Vomiting  . Flagyl [Metronidazole Hcl] Swelling and Rash    JOINT SWELLING  . Hydrocodone Itching    Social History  Substance Use Topics  . Smoking status: Former Smoker    Quit date: 07/16/1991  . Smokeless tobacco: Never Used  . Alcohol Use: No    Family History  Problem Relation Age of Onset  . Anesthesia problems Father     very agitated waking up post-op  . Alzheimer's disease Father   . Hyperlipidemia Father   .  Heart attack Father   . Heart attack Mother   . Heart disease Mother   . Hypertension Mother   . Melanoma Mother   . Stroke Mother      Review of Systems  Constitutional: Negative.   HENT: Negative for congestion, ear discharge, ear pain, hearing loss, nosebleeds, sore throat and tinnitus.   Eyes: Negative.   Respiratory: Negative.  Negative for stridor.   Cardiovascular: Negative.   Gastrointestinal:  Negative.   Genitourinary: Negative.   Musculoskeletal: Positive for back pain and joint pain.  Skin: Negative.   Neurological: Positive for headaches. Negative for dizziness, tingling, tremors, sensory change, speech change, focal weakness, seizures and loss of consciousness.  Endo/Heme/Allergies: Negative.   Psychiatric/Behavioral: Negative.     Objective:  Physical Exam  Vitals reviewed. Constitutional: She is oriented to person, place, and time. She appears well-developed and well-nourished.  HENT:  Head: Normocephalic and atraumatic.  Eyes: Conjunctivae and EOM are normal. Pupils are equal, round, and reactive to light.  Neck: Normal range of motion. Neck supple.  Cardiovascular: Normal rate, regular rhythm and intact distal pulses.   Respiratory: Effort normal and breath sounds normal. No respiratory distress.  GI: Soft. Bowel sounds are normal. She exhibits no distension.  Genitourinary:  deferred  Musculoskeletal:       Left hip: She exhibits decreased range of motion and crepitus.  NVI  Neurological: She is alert and oriented to person, place, and time. She has normal reflexes.  Skin: Skin is warm and dry.  Psychiatric: She has a normal mood and affect. Her behavior is normal. Judgment and thought content normal.    Vital signs in last 24 hours: @VSRANGES @  Labs:   Estimated body mass index is 24.81 kg/(m^2) as calculated from the following:   Height as of 08/10/15: 5\' 5"  (1.651 m).   Weight as of 08/10/15: 67.64 kg (149 lb 1.9 oz).   Imaging Review Plain radiographs demonstrate moderate degenerative joint disease of the left hip(s). The bone quality appears to be adequate for age and reported activity level.  Assessment/Plan:  End stage arthritis, left hip with collapsed subchondral fracture  The patient history, physical examination, clinical judgement of the provider and imaging studies are consistent with end stage degenerative joint disease of the left  hip(s) and total hip arthroplasty is deemed medically necessary. The treatment options including medical management, injection therapy, arthroscopy and arthroplasty were discussed at length. The risks and benefits of total hip arthroplasty were presented and reviewed. The risks due to aseptic loosening, infection, stiffness, dislocation/subluxation,  thromboembolic complications and other imponderables were discussed.  The patient acknowledged the explanation, agreed to proceed with the plan and consent was signed. Patient is being admitted for inpatient treatment for surgery, pain control, PT, OT, prophylactic antibiotics, VTE prophylaxis, progressive ambulation and ADL's and discharge planning.The patient is planning to be discharged home with home health services

## 2015-09-13 ENCOUNTER — Encounter (HOSPITAL_COMMUNITY): Payer: Self-pay

## 2015-09-13 ENCOUNTER — Encounter (HOSPITAL_COMMUNITY)
Admission: RE | Admit: 2015-09-13 | Discharge: 2015-09-13 | Disposition: A | Discharge status: Home / Self Care | Payer: Medicare Other | Source: Ambulatory Visit | Attending: Orthopedic Surgery | Admitting: Orthopedic Surgery

## 2015-09-13 DIAGNOSIS — Z881 Allergy status to other antibiotic agents status: Secondary | ICD-10-CM | POA: Diagnosis not present

## 2015-09-13 DIAGNOSIS — Z885 Allergy status to narcotic agent status: Secondary | ICD-10-CM | POA: Diagnosis not present

## 2015-09-13 DIAGNOSIS — E785 Hyperlipidemia, unspecified: Secondary | ICD-10-CM | POA: Insufficient documentation

## 2015-09-13 DIAGNOSIS — M1612 Unilateral primary osteoarthritis, left hip: Secondary | ICD-10-CM | POA: Insufficient documentation

## 2015-09-13 DIAGNOSIS — Z01812 Encounter for preprocedural laboratory examination: Secondary | ICD-10-CM | POA: Diagnosis not present

## 2015-09-13 DIAGNOSIS — Z87891 Personal history of nicotine dependence: Secondary | ICD-10-CM | POA: Diagnosis not present

## 2015-09-13 DIAGNOSIS — Z8249 Family history of ischemic heart disease and other diseases of the circulatory system: Secondary | ICD-10-CM | POA: Diagnosis not present

## 2015-09-13 DIAGNOSIS — Z0183 Encounter for blood typing: Secondary | ICD-10-CM | POA: Diagnosis not present

## 2015-09-13 HISTORY — DX: Personal history of other diseases of the respiratory system: Z87.09

## 2015-09-13 HISTORY — DX: Spondylosis without myelopathy or radiculopathy, lumbar region: M47.816

## 2015-09-13 HISTORY — DX: Family history of other specified conditions: Z84.89

## 2015-09-13 HISTORY — DX: Scoliosis, unspecified: M41.9

## 2015-09-13 HISTORY — DX: Polyp of colon: K63.5

## 2015-09-13 HISTORY — DX: Gastro-esophageal reflux disease without esophagitis: K21.9

## 2015-09-13 HISTORY — DX: Essential (primary) hypertension: I10

## 2015-09-13 HISTORY — DX: Presence of spectacles and contact lenses: Z97.3

## 2015-09-13 HISTORY — DX: Reserved for inherently not codable concepts without codable children: IMO0001

## 2015-09-13 HISTORY — DX: Unspecified rotator cuff tear or rupture of unspecified shoulder, not specified as traumatic: M75.100

## 2015-09-13 LAB — COMPREHENSIVE METABOLIC PANEL
ALBUMIN: 4.2 g/dL (ref 3.5–5.0)
ALK PHOS: 100 U/L (ref 38–126)
ALT: 29 U/L (ref 14–54)
ANION GAP: 9 (ref 5–15)
AST: 37 U/L (ref 15–41)
BUN: 14 mg/dL (ref 6–20)
CHLORIDE: 104 mmol/L (ref 101–111)
CO2: 27 mmol/L (ref 22–32)
Calcium: 9.6 mg/dL (ref 8.9–10.3)
Creatinine, Ser: 0.84 mg/dL (ref 0.44–1.00)
GFR calc Af Amer: >60 mL/min (ref >60)
GFR calc non Af Amer: >60 mL/min (ref >60)
GLUCOSE: 84 mg/dL (ref 65–99)
POTASSIUM: 3.9 mmol/L (ref 3.5–5.1)
SODIUM: 140 mmol/L (ref 135–145)
Total Bilirubin: 0.9 mg/dL (ref 0.3–1.2)
Total Protein: 7.3 g/dL (ref 6.5–8.1)

## 2015-09-13 LAB — URINALYSIS, ROUTINE W REFLEX MICROSCOPIC
BILIRUBIN URINE: NEGATIVE
Glucose, UA: NEGATIVE mg/dL
HGB URINE DIPSTICK: NEGATIVE
Ketones, ur: NEGATIVE mg/dL
Leukocytes, UA: NEGATIVE
Nitrite: NEGATIVE
PROTEIN: NEGATIVE mg/dL
Specific Gravity, Urine: 1.007 (ref 1.005–1.030)
pH: 7 (ref 5.0–8.0)

## 2015-09-13 LAB — CBC
HCT: 42.9 % (ref 36.0–46.0)
Hemoglobin: 14.5 g/dL (ref 12.0–15.0)
MCH: 32.3 pg (ref 26.0–34.0)
MCHC: 33.8 g/dL (ref 30.0–36.0)
MCV: 95.5 fL (ref 78.0–100.0)
Platelets: 303 10*3/uL (ref 150–400)
RBC: 4.49 MIL/uL (ref 3.87–5.11)
RDW: 11.9 % (ref 11.5–15.5)
WBC: 5.8 10*3/uL (ref 4.0–10.5)

## 2015-09-13 LAB — APTT: APTT: 27 s (ref 24–37)

## 2015-09-13 LAB — ABO/RH: ABO/RH(D): O POS

## 2015-09-13 LAB — PROTIME-INR
INR: 0.92 (ref 0.00–1.49)
Prothrombin Time: 12.5 seconds (ref 11.6–15.2)

## 2015-09-13 LAB — SURGICAL PCR SCREEN
MRSA, PCR: NEGATIVE
Staphylococcus aureus: NEGATIVE

## 2015-09-13 NOTE — Progress Notes (Addendum)
Clearance note per Dr Irish Lack 2/1/2017on chart / cardiologist Clearance note per Dr Rex Kras 08/16/2015 on chart with H&P  EKG/epic 08/10/2015 Stress test per epic 08/16/2015  [facility] [facility] 08/10/2015/epic

## 2015-09-13 NOTE — Patient Instructions (Signed)
Jean Hudson  09/13/2015   Your procedure is scheduled on: Thursday September 21, 2015  Report to [facility]  Entrance take [place]  elevators to 3rd floor to  [place] at 10:45 AM.  Call this number if you have problems the morning of surgery [phone]   Remember: ONLY 1 PERSON MAY GO WITH YOU TO SHORT STAY TO GET  READY MORNING OF [place].  Do not eat food or drink liquids :After Midnight.     Take these medicines the morning of surgery with A SIP OF WATER: Clonidine (Catapres); May use albuterol inhaler if needed (bring with you day of surgery); Advair Diskus (bring with you day of surgery)                               You may not have any metal on your body including hair pins and              piercings  Do not wear jewelry, make-up, lotions, powders or perfumes, deodorant             Do not wear nail polish.  Do not shave  48 hours prior to surgery.               Do not bring valuables to the hospital. [place].  Contacts, dentures or bridgework may not be worn into surgery.  Leave suitcase in the car. After surgery it may be brought to your room.                Please read over the following fact sheets you were given:MRSA INFORMATION SHEET; INCENTIVE SPIROMETER; BLOOD TRANSFUSION INFORMATION SHEET  _____________________________________________________________________             [facility] - Preparing for Surgery Before surgery, you can play an important role.  Because skin is not sterile, your skin needs to be as free of germs as possible.  You can reduce the number of germs on your skin by washing with CHG (chlorahexidine gluconate) soap before surgery.  CHG is an antiseptic cleaner which kills germs and bonds with the skin to continue killing germs even after washing. Please DO NOT use if you have an allergy to CHG or antibacterial soaps.  If your skin becomes reddened/irritated stop  using the CHG and inform your nurse when you arrive at Short Stay. Do not shave (including legs and underarms) for at least 48 hours prior to the first CHG shower.  You may shave your face/neck. Please follow these instructions carefully:  1.  Shower with CHG Soap the night before surgery and the  morning of Surgery.  2.  If you choose to wash your hair, wash your hair first as usual with your  normal  shampoo.  3.  After you shampoo, rinse your hair and body thoroughly to remove the  shampoo.                           4.  Use CHG as you would any other liquid soap.  You can apply chg directly  to the skin and wash  Gently with a scrungie or clean washcloth.  5.  Apply the CHG Soap to your body ONLY FROM THE NECK DOWN.   Do not use on face/ open                           Wound or open sores. Avoid contact with eyes, ears mouth and genitals (private parts).                       Wash face,  Genitals (private parts) with your normal soap.             6.  Wash thoroughly, paying special attention to the area where your surgery  will be performed.  7.  Thoroughly rinse your body with warm water from the neck down.  8.  DO NOT shower/wash with your normal soap after using and rinsing off  the CHG Soap.                9.  Pat yourself dry with a clean towel.            10.  Wear clean pajamas.            11.  Place clean sheets on your bed the night of your first shower and do not  sleep with pets. Day of Surgery : Do not apply any lotions/deodorants the morning of surgery.  Please wear clean clothes to the hospital/surgery center.  FAILURE TO FOLLOW THESE INSTRUCTIONS MAY RESULT IN THE CANCELLATION OF YOUR SURGERY PATIENT SIGNATURE_________________________________  NURSE SIGNATURE__________________________________  ________________________________________________________________________   Jean Hudson  An incentive spirometer is a tool that can help keep your  lungs clear and active. This tool measures how well you are filling your lungs with each breath. Taking long deep breaths may help reverse or decrease the chance of developing breathing (pulmonary) problems (especially infection) following:  A long period of time when you are unable to move or be active. BEFORE THE PROCEDURE   If the spirometer includes an indicator to show your best effort, your nurse or respiratory therapist will set it to a desired goal.  If possible, sit up straight or lean slightly forward. Try not to slouch.  Hold the incentive spirometer in an upright position. INSTRUCTIONS FOR USE   Sit on the edge of your bed if possible, or sit up as far as you can in bed or on a chair.  Hold the incentive spirometer in an upright position.  Breathe out normally.  Place the mouthpiece in your mouth and seal your lips tightly around it.  Breathe in slowly and as deeply as possible, raising the piston or the ball toward the top of the column.  Hold your breath for 3-5 seconds or for as long as possible. Allow the piston or ball to fall to the bottom of the column.  Remove the mouthpiece from your mouth and breathe out normally.  Rest for a few seconds and repeat Steps 1 through 7 at least 10 times every 1-2 hours when you are awake. Take your time and take a few normal breaths between deep breaths.  The spirometer may include an indicator to show your best effort. Use the indicator as a goal to work toward during each repetition.  After each set of 10 deep breaths, practice coughing to be sure your lungs are clear. If you have an incision (the cut made at the time of surgery),  support your incision when coughing by placing a pillow or rolled up towels firmly against it. Once you are able to get out of bed, walk around indoors and cough well. You may stop using the incentive spirometer when instructed by your caregiver.  RISKS AND COMPLICATIONS  Take your time so you do not  get dizzy or light-headed.  If you are in pain, you may need to take or ask for pain medication before doing incentive spirometry. It is harder to take a deep breath if you are having pain. AFTER USE  Rest and breathe slowly and easily.  It can be helpful to keep track of a log of your progress. Your caregiver can provide you with a simple table to help with this. If you are using the spirometer at home, follow these instructions: [place] IF:   You are having difficultly using the spirometer.  You have trouble using the spirometer as often as instructed.  Your pain medication is not giving enough relief while using the spirometer.  You develop fever of 100.5 F (38.1 C) or higher. SEEK IMMEDIATE MEDICAL CARE IF:   You cough up bloody sputum that had not been present before.  You develop fever of 102 F (38.9 C) or greater.  You develop worsening pain at or near the incision site. MAKE SURE YOU:   Understand these instructions.  Will watch your condition.  Will get help right away if you are not doing well or get worse. Document Released: 11/11/2006 Document Revised: 09/23/2011 Document Reviewed: 01/12/2007 ExitCare Patient Information 2014 [employer], [place].   ________________________________________________________________________  WHAT IS A BLOOD TRANSFUSION? Blood Transfusion Information  A transfusion is the replacement of blood or some of its parts. Blood is made up of multiple cells which provide different functions.  Red blood cells carry oxygen and are used for blood loss replacement.  White blood cells fight against infection.  Platelets control bleeding.  Plasma helps clot blood.  Other blood products are available for specialized needs, such as hemophilia or other clotting disorders. BEFORE THE TRANSFUSION  Who gives blood for transfusions?   Healthy volunteers who are fully evaluated to make sure their blood is safe. This is blood bank  blood. Transfusion therapy is the safest it has ever been in the practice of medicine. Before blood is taken from a donor, a complete history is taken to make sure that person has no history of diseases nor engages in risky social behavior (examples are intravenous drug use or sexual activity with multiple partners). The donor's travel history is screened to minimize risk of transmitting infections, such as malaria. The donated blood is tested for signs of infectious diseases, such as HIV and hepatitis. The blood is then tested to be sure it is compatible with you in order to minimize the chance of a transfusion reaction. If you or a relative donates blood, this is often done in anticipation of surgery and is not appropriate for emergency situations. It takes many days to process the donated blood. RISKS AND COMPLICATIONS Although transfusion therapy is very safe and saves many lives, the main dangers of transfusion include:   Getting an infectious disease.  Developing a transfusion reaction. This is an allergic reaction to something in the blood you were given. Every precaution is taken to prevent this. The decision to have a blood transfusion has been considered carefully by your caregiver before blood is given. Blood is not given unless the benefits outweigh the risks. AFTER THE TRANSFUSION  Right after receiving a blood transfusion, you will usually feel much better and more energetic. This is especially true if your red blood cells have gotten low (anemic). The transfusion raises the level of the red blood cells which carry oxygen, and this usually causes an energy increase.  The nurse administering the transfusion will monitor you carefully for complications. HOME CARE INSTRUCTIONS  No special instructions are needed after a transfusion. You may find your energy is better. Speak with your caregiver about any limitations on activity for underlying diseases you may have. SEEK MEDICAL CARE IF:    Your condition is not improving after your transfusion.  You develop redness or irritation at the intravenous (IV) site. SEEK IMMEDIATE MEDICAL CARE IF:  Any of the following symptoms occur over the next 12 hours:  Shaking chills.  You have a temperature by mouth above 102 F (38.9 C), not controlled by medicine.  Chest, back, or muscle pain.  People around you feel you are not acting correctly or are confused.  Shortness of breath or difficulty breathing.  Dizziness and fainting.  You get a rash or develop hives.  You have a decrease in urine output.  Your urine turns a dark color or changes to pink, red, or brown. Any of the following symptoms occur over the next 10 days:  You have a temperature by mouth above 102 F (38.9 C), not controlled by medicine.  Shortness of breath.  Weakness after normal activity.  The white part of the eye turns yellow (jaundice).  You have a decrease in the amount of urine or are urinating less often.  Your urine turns a dark color or changes to pink, red, or brown. Document Released: 06/28/2000 Document Revised: 09/23/2011 Document Reviewed: 02/15/2008 [facility] Patient Information 2014 [place], [place].  _______________________________________________________________________

## 2015-09-14 ENCOUNTER — Other Ambulatory Visit (HOSPITAL_COMMUNITY): Payer: Medicare Other

## 2015-09-21 ENCOUNTER — Inpatient Hospital Stay (HOSPITAL_COMMUNITY): Payer: Medicare Other

## 2015-09-21 ENCOUNTER — Inpatient Hospital Stay (HOSPITAL_COMMUNITY): Payer: Medicare Other | Admitting: Anesthesiology

## 2015-09-21 ENCOUNTER — Encounter (HOSPITAL_COMMUNITY)
Admission: RE | Disposition: A | Discharge status: Home Health | Payer: Self-pay | Source: Ambulatory Visit | Attending: Orthopedic Surgery

## 2015-09-21 ENCOUNTER — Encounter (HOSPITAL_COMMUNITY): Payer: Self-pay | Admitting: *Deleted

## 2015-09-21 ENCOUNTER — Inpatient Hospital Stay (HOSPITAL_COMMUNITY)
Admission: RE | Admit: 2015-09-21 | Discharge: 2015-09-22 | DRG: 470 | Disposition: A | Discharge status: Home Health | Payer: Medicare Other | Source: Ambulatory Visit | Attending: Orthopedic Surgery | Admitting: Orthopedic Surgery

## 2015-09-21 DIAGNOSIS — M1612 Unilateral primary osteoarthritis, left hip: Principal | ICD-10-CM | POA: Diagnosis present

## 2015-09-21 DIAGNOSIS — Z01812 Encounter for preprocedural laboratory examination: Secondary | ICD-10-CM

## 2015-09-21 DIAGNOSIS — M25552 Pain in left hip: Secondary | ICD-10-CM | POA: Diagnosis not present

## 2015-09-21 DIAGNOSIS — I10 Essential (primary) hypertension: Secondary | ICD-10-CM | POA: Diagnosis present

## 2015-09-21 DIAGNOSIS — Z87891 Personal history of nicotine dependence: Secondary | ICD-10-CM | POA: Diagnosis not present

## 2015-09-21 DIAGNOSIS — J45909 Unspecified asthma, uncomplicated: Secondary | ICD-10-CM | POA: Diagnosis present

## 2015-09-21 DIAGNOSIS — R52 Pain, unspecified: Secondary | ICD-10-CM

## 2015-09-21 DIAGNOSIS — M84359K Stress fracture, hip, unspecified, subsequent encounter for fracture with nonunion: Secondary | ICD-10-CM | POA: Diagnosis present

## 2015-09-21 DIAGNOSIS — M84352A Stress fracture, left femur, initial encounter for fracture: Secondary | ICD-10-CM | POA: Diagnosis not present

## 2015-09-21 DIAGNOSIS — Z96641 Presence of right artificial hip joint: Secondary | ICD-10-CM | POA: Diagnosis not present

## 2015-09-21 DIAGNOSIS — Z471 Aftercare following joint replacement surgery: Secondary | ICD-10-CM | POA: Diagnosis not present

## 2015-09-21 DIAGNOSIS — S72012A Unspecified intracapsular fracture of left femur, initial encounter for closed fracture: Secondary | ICD-10-CM | POA: Diagnosis not present

## 2015-09-21 HISTORY — PX: TOTAL HIP ARTHROPLASTY: SHX124

## 2015-09-21 LAB — TYPE AND SCREEN
ABO/RH(D): O POS
Antibody Screen: NEGATIVE

## 2015-09-21 SURGERY — ARTHROPLASTY, HIP, TOTAL, ANTERIOR APPROACH
Anesthesia: General | Site: Hip | Laterality: Left

## 2015-09-21 MED ORDER — SUGAMMADEX SODIUM 200 MG/2ML IV SOLN
INTRAVENOUS | Status: DC | PRN
  Administered 2015-09-21: 100 mg via INTRAVENOUS

## 2015-09-21 MED ORDER — HYDROMORPHONE HCL 1 MG/ML IJ SOLN: 0.5000 mg | INTRAMUSCULAR | Status: DC | PRN

## 2015-09-21 MED ORDER — METHOCARBAMOL 500 MG PO TABS: 500.0000 mg | ORAL_TABLET | Freq: Four times a day (QID) | ORAL | Status: DC | PRN

## 2015-09-21 MED ORDER — METOCLOPRAMIDE HCL 10 MG PO TABS: 5.0000 mg | ORAL_TABLET | Freq: Three times a day (TID) | ORAL | Status: DC | PRN

## 2015-09-21 MED ORDER — CHLORHEXIDINE GLUCONATE 4 % EX LIQD: 60.0000 mL | Freq: Once | CUTANEOUS | Status: DC

## 2015-09-21 MED ORDER — POVIDONE-IODINE 10 % EX SOLN
CUTANEOUS | Status: DC | PRN
  Administered 2015-09-21: 1 via TOPICAL

## 2015-09-21 MED ORDER — ALUM & MAG HYDROXIDE-SIMETH 200-200-20 MG/5ML PO SUSP
30.0000 mL | ORAL | Status: DC | PRN
Start: 2015-09-21 — End: 2015-09-22

## 2015-09-21 MED ORDER — HYDROCODONE-ACETAMINOPHEN 5-325 MG PO TABS
1.0000 | ORAL_TABLET | ORAL | Status: DC | PRN
  Administered 2015-09-21 – 2015-09-22 (×3): 1 via ORAL
  Filled 2015-09-21 (×3): qty 1

## 2015-09-21 MED ORDER — ROCURONIUM BROMIDE 100 MG/10ML IV SOLN
INTRAVENOUS | Status: DC | PRN
  Administered 2015-09-21: 50 mg via INTRAVENOUS

## 2015-09-21 MED ORDER — ISOPROPYL ALCOHOL 70 % SOLN
Status: DC | PRN
  Administered 2015-09-21: 1 via TOPICAL

## 2015-09-21 MED ORDER — METHOCARBAMOL 1000 MG/10ML IJ SOLN
500.0000 mg | Freq: Four times a day (QID) | INTRAVENOUS | Status: DC | PRN
  Administered 2015-09-21: 500 mg via INTRAVENOUS
  Filled 2015-09-21 (×2): qty 5

## 2015-09-21 MED ORDER — KETOROLAC TROMETHAMINE 30 MG/ML IJ SOLN
INTRAMUSCULAR | Status: AC
  Filled 2015-09-21: qty 1

## 2015-09-21 MED ORDER — PROPOFOL 10 MG/ML IV BOLUS
INTRAVENOUS | Status: AC
  Filled 2015-09-21: qty 20

## 2015-09-21 MED ORDER — LIDOCAINE HCL (CARDIAC) 20 MG/ML IV SOLN
INTRAVENOUS | Status: AC
  Filled 2015-09-21: qty 5

## 2015-09-21 MED ORDER — HYDROMORPHONE HCL 1 MG/ML IJ SOLN
INTRAMUSCULAR | Status: DC | PRN
  Administered 2015-09-21 (×2): 0.5 mg via INTRAVENOUS

## 2015-09-21 MED ORDER — WATER FOR IRRIGATION, STERILE IR SOLN: Status: DC | PRN

## 2015-09-21 MED ORDER — HYDROGEN PEROXIDE 3 % EX SOLN
CUTANEOUS | Status: DC | PRN
  Administered 2015-09-21: 1

## 2015-09-21 MED ORDER — STERILE WATER FOR IRRIGATION IR SOLN
Status: DC | PRN
  Administered 2015-09-21: 2000 mL

## 2015-09-21 MED ORDER — ONDANSETRON HCL 4 MG/2ML IJ SOLN: 4.0000 mg | Freq: Four times a day (QID) | INTRAMUSCULAR | Status: DC | PRN

## 2015-09-21 MED ORDER — DEXAMETHASONE SODIUM PHOSPHATE 10 MG/ML IJ SOLN
INTRAMUSCULAR | Status: AC
Start: 2015-09-21 — End: 2015-09-21
  Filled 2015-09-21: qty 1

## 2015-09-21 MED ORDER — CEFAZOLIN SODIUM-DEXTROSE 2-3 GM-% IV SOLR
2.0000 g | INTRAVENOUS | Status: AC
  Administered 2015-09-21: 2 g via INTRAVENOUS

## 2015-09-21 MED ORDER — ACETAMINOPHEN 325 MG PO TABS: 650.0000 mg | ORAL_TABLET | Freq: Four times a day (QID) | ORAL | Status: DC | PRN

## 2015-09-21 MED ORDER — PHENOL 1.4 % MT LIQD: 1.0000 | OROMUCOSAL | Status: DC | PRN

## 2015-09-21 MED ORDER — DIPHENHYDRAMINE HCL 12.5 MG/5ML PO ELIX
12.5000 mg | ORAL_SOLUTION | ORAL | Status: DC | PRN
  Administered 2015-09-21 (×2): 12.5 mg via ORAL
  Administered 2015-09-22 (×2): 25 mg via ORAL
  Filled 2015-09-21 (×3): qty 10

## 2015-09-21 MED ORDER — HYDROMORPHONE HCL 1 MG/ML IJ SOLN
INTRAMUSCULAR | Status: AC
Start: 2015-09-21 — End: 2015-09-22
  Filled 2015-09-21: qty 1

## 2015-09-21 MED ORDER — PROMETHAZINE HCL 25 MG/ML IJ SOLN: 6.2500 mg | INTRAMUSCULAR | Status: DC | PRN

## 2015-09-21 MED ORDER — HYDROMORPHONE HCL 2 MG/ML IJ SOLN
INTRAMUSCULAR | Status: AC
  Filled 2015-09-21: qty 1

## 2015-09-21 MED ORDER — ONDANSETRON HCL 4 MG/2ML IJ SOLN
INTRAMUSCULAR | Status: AC
  Filled 2015-09-21: qty 2

## 2015-09-21 MED ORDER — KETOROLAC TROMETHAMINE 30 MG/ML IJ SOLN
INTRAMUSCULAR | Status: DC | PRN
  Administered 2015-09-21: 30 mg

## 2015-09-21 MED ORDER — DEXAMETHASONE SODIUM PHOSPHATE 4 MG/ML IJ SOLN
INTRAMUSCULAR | Status: DC | PRN
  Administered 2015-09-21: 10 mg via INTRAVENOUS

## 2015-09-21 MED ORDER — HYDROGEN PEROXIDE 3 % EX SOLN
CUTANEOUS | Status: AC
  Filled 2015-09-21: qty 473

## 2015-09-21 MED ORDER — SUGAMMADEX SODIUM 200 MG/2ML IV SOLN
INTRAVENOUS | Status: AC
  Filled 2015-09-21: qty 2

## 2015-09-21 MED ORDER — LIDOCAINE HCL (CARDIAC) 20 MG/ML IV SOLN
INTRAVENOUS | Status: DC | PRN
  Administered 2015-09-21: 30 mg via INTRAVENOUS

## 2015-09-21 MED ORDER — ACETAMINOPHEN 10 MG/ML IV SOLN
INTRAVENOUS | Status: AC
  Filled 2015-09-21: qty 100

## 2015-09-21 MED ORDER — ISOPROPYL ALCOHOL 70 % SOLN
Status: AC
  Filled 2015-09-21: qty 480

## 2015-09-21 MED ORDER — DEXAMETHASONE SODIUM PHOSPHATE 10 MG/ML IJ SOLN
10.0000 mg | Freq: Once | INTRAMUSCULAR | Status: AC
  Administered 2015-09-22: 10 mg via INTRAVENOUS
  Filled 2015-09-21: qty 1

## 2015-09-21 MED ORDER — CEFAZOLIN SODIUM-DEXTROSE 2-3 GM-% IV SOLR
INTRAVENOUS | Status: AC
  Filled 2015-09-21: qty 50

## 2015-09-21 MED ORDER — ATORVASTATIN CALCIUM 10 MG PO TABS
10.0000 mg | ORAL_TABLET | Freq: Every day | ORAL | Status: DC
  Administered 2015-09-21: 10 mg via ORAL
  Filled 2015-09-21 (×2): qty 1

## 2015-09-21 MED ORDER — CEFAZOLIN SODIUM 1-5 GM-% IV SOLN
1.0000 g | Freq: Four times a day (QID) | INTRAVENOUS | Status: AC
  Administered 2015-09-21 – 2015-09-22 (×2): 1 g via INTRAVENOUS
  Filled 2015-09-21 (×2): qty 50

## 2015-09-21 MED ORDER — SODIUM CHLORIDE 0.9 % IJ SOLN
INTRAMUSCULAR | Status: AC
  Filled 2015-09-21: qty 50

## 2015-09-21 MED ORDER — PROPOFOL 10 MG/ML IV BOLUS
INTRAVENOUS | Status: DC | PRN
  Administered 2015-09-21: 30 mg via INTRAVENOUS
  Administered 2015-09-21: 140 mg via INTRAVENOUS

## 2015-09-21 MED ORDER — SODIUM CHLORIDE 0.9 % IV SOLN: INTRAVENOUS | Status: DC

## 2015-09-21 MED ORDER — MENTHOL 3 MG MT LOZG: 1.0000 | LOZENGE | OROMUCOSAL | Status: DC | PRN

## 2015-09-21 MED ORDER — FENTANYL CITRATE (PF) 250 MCG/5ML IJ SOLN
INTRAMUSCULAR | Status: AC
  Filled 2015-09-21: qty 5

## 2015-09-21 MED ORDER — MIDAZOLAM HCL 5 MG/5ML IJ SOLN
INTRAMUSCULAR | Status: DC | PRN
  Administered 2015-09-21: 2 mg via INTRAVENOUS

## 2015-09-21 MED ORDER — ONDANSETRON HCL 4 MG/2ML IJ SOLN
INTRAMUSCULAR | Status: DC | PRN
  Administered 2015-09-21: 4 mg via INTRAVENOUS

## 2015-09-21 MED ORDER — KETOROLAC TROMETHAMINE 15 MG/ML IJ SOLN
15.0000 mg | Freq: Four times a day (QID) | INTRAMUSCULAR | Status: DC
  Administered 2015-09-21 – 2015-09-22 (×3): 15 mg via INTRAVENOUS
  Filled 2015-09-21 (×4): qty 1

## 2015-09-21 MED ORDER — SODIUM CHLORIDE 0.9 % IJ SOLN
INTRAMUSCULAR | Status: DC | PRN
  Administered 2015-09-21: 30 mL

## 2015-09-21 MED ORDER — ACETAMINOPHEN 10 MG/ML IV SOLN
1000.0000 mg | INTRAVENOUS | Status: AC
  Administered 2015-09-21: 1000 mg via INTRAVENOUS
  Filled 2015-09-21: qty 100

## 2015-09-21 MED ORDER — METOCLOPRAMIDE HCL 5 MG/ML IJ SOLN: 5.0000 mg | Freq: Three times a day (TID) | INTRAMUSCULAR | Status: DC | PRN

## 2015-09-21 MED ORDER — DOCUSATE SODIUM 100 MG PO CAPS
100.0000 mg | ORAL_CAPSULE | Freq: Two times a day (BID) | ORAL | Status: DC
  Administered 2015-09-21 – 2015-09-22 (×2): 100 mg via ORAL

## 2015-09-21 MED ORDER — MOMETASONE FURO-FORMOTEROL FUM 200-5 MCG/ACT IN AERO
2.0000 | INHALATION_SPRAY | Freq: Two times a day (BID) | RESPIRATORY_TRACT | Status: DC
  Filled 2015-09-21: qty 8.8

## 2015-09-21 MED ORDER — SODIUM CHLORIDE 0.9 % IR SOLN
Status: DC | PRN
  Administered 2015-09-21: 2000 mL

## 2015-09-21 MED ORDER — SODIUM CHLORIDE 0.9 % IV SOLN
INTRAVENOUS | Status: DC
  Administered 2015-09-21 – 2015-09-22 (×2): via INTRAVENOUS

## 2015-09-21 MED ORDER — SODIUM CHLORIDE 0.9 % IR SOLN
Status: DC | PRN
  Administered 2015-09-21: 1000 mL

## 2015-09-21 MED ORDER — MIDAZOLAM HCL 2 MG/2ML IJ SOLN
INTRAMUSCULAR | Status: AC
  Filled 2015-09-21: qty 2

## 2015-09-21 MED ORDER — BUPIVACAINE-EPINEPHRINE (PF) 0.25% -1:200000 IJ SOLN
INTRAMUSCULAR | Status: AC
Start: 2015-09-21 — End: 2015-09-21
  Filled 2015-09-21: qty 30

## 2015-09-21 MED ORDER — ONDANSETRON HCL 4 MG PO TABS: 4.0000 mg | ORAL_TABLET | Freq: Four times a day (QID) | ORAL | Status: DC | PRN

## 2015-09-21 MED ORDER — SENNA 8.6 MG PO TABS
2.0000 | ORAL_TABLET | Freq: Every day | ORAL | Status: DC
  Administered 2015-09-21: 17.2 mg via ORAL

## 2015-09-21 MED ORDER — SODIUM CHLORIDE 0.9 % IV SOLN
1000.0000 mg | INTRAVENOUS | Status: AC
  Administered 2015-09-21: 1000 mg via INTRAVENOUS
  Filled 2015-09-21: qty 10

## 2015-09-21 MED ORDER — CLONIDINE HCL 0.1 MG PO TABS
0.1000 mg | ORAL_TABLET | Freq: Two times a day (BID) | ORAL | Status: DC
  Administered 2015-09-21 – 2015-09-22 (×2): 0.1 mg via ORAL
  Filled 2015-09-21 (×3): qty 1

## 2015-09-21 MED ORDER — HYDROMORPHONE HCL 1 MG/ML IJ SOLN
0.2500 mg | INTRAMUSCULAR | Status: DC | PRN
  Administered 2015-09-21 (×2): 0.5 mg via INTRAVENOUS

## 2015-09-21 MED ORDER — LACTATED RINGERS IV SOLN
INTRAVENOUS | Status: DC | PRN
  Administered 2015-09-21 (×2): via INTRAVENOUS

## 2015-09-21 MED ORDER — MONTELUKAST SODIUM 10 MG PO TABS
10.0000 mg | ORAL_TABLET | Freq: Every day | ORAL | Status: DC
  Administered 2015-09-21: 10 mg via ORAL
  Filled 2015-09-21 (×2): qty 1

## 2015-09-21 MED ORDER — ROCURONIUM BROMIDE 100 MG/10ML IV SOLN
INTRAVENOUS | Status: AC
  Filled 2015-09-21: qty 1

## 2015-09-21 MED ORDER — BUPIVACAINE-EPINEPHRINE 0.25% -1:200000 IJ SOLN
INTRAMUSCULAR | Status: DC | PRN
  Administered 2015-09-21: 30 mL

## 2015-09-21 MED ORDER — ASPIRIN EC 325 MG PO TBEC
325.0000 mg | DELAYED_RELEASE_TABLET | Freq: Two times a day (BID) | ORAL | Status: DC
  Administered 2015-09-22: 325 mg via ORAL
  Filled 2015-09-21 (×3): qty 1

## 2015-09-21 MED ORDER — FENTANYL CITRATE (PF) 100 MCG/2ML IJ SOLN
INTRAMUSCULAR | Status: DC | PRN
  Administered 2015-09-21 (×3): 50 ug via INTRAVENOUS
  Administered 2015-09-21: 100 ug via INTRAVENOUS

## 2015-09-21 MED ORDER — ALBUTEROL SULFATE (2.5 MG/3ML) 0.083% IN NEBU: 3.0000 mL | INHALATION_SOLUTION | RESPIRATORY_TRACT | Status: DC | PRN

## 2015-09-21 MED ORDER — ACETAMINOPHEN 650 MG RE SUPP: 650.0000 mg | Freq: Four times a day (QID) | RECTAL | Status: DC | PRN

## 2015-09-21 SURGICAL SUPPLY — 45 items
CAPT HIP TOTAL 2 ×2 IMPLANT
CHLORAPREP W/TINT 26ML (MISCELLANEOUS) ×3 IMPLANT
CLOTH BEACON ORANGE TIMEOUT ST (SAFETY) ×3 IMPLANT
COVER PERINEAL POST (MISCELLANEOUS) ×3 IMPLANT
DECANTER SPIKE VIAL GLASS SM (MISCELLANEOUS) ×3 IMPLANT
DRAPE LG THREE QUARTER DISP (DRAPES) ×6 IMPLANT
DRAPE STERI IOBAN 125X83 (DRAPES) ×3 IMPLANT
DRAPE U-SHAPE 47X51 STRL (DRAPES) ×6 IMPLANT
DRSG AQUACEL AG ADV 3.5X10 (GAUZE/BANDAGES/DRESSINGS) ×3 IMPLANT
ELECT REM PT RETURN 15FT ADLT (MISCELLANEOUS) ×3 IMPLANT
GAUZE SPONGE 4X4 12PLY STRL (GAUZE/BANDAGES/DRESSINGS) ×3 IMPLANT
GLOVE BIO SURGEON STRL SZ7.5 (GLOVE) ×3 IMPLANT
GLOVE BIO SURGEON STRL SZ8.5 (GLOVE) ×8 IMPLANT
GLOVE BIOGEL M STRL SZ7.5 (GLOVE) ×2 IMPLANT
GLOVE BIOGEL PI IND STRL 7.5 (GLOVE) ×3 IMPLANT
GLOVE BIOGEL PI IND STRL 8.5 (GLOVE) ×1 IMPLANT
GLOVE BIOGEL PI INDICATOR 7.5 (GLOVE) ×6
GLOVE BIOGEL PI INDICATOR 8.5 (GLOVE) ×2
GLOVE SURG SS PI 7.5 STRL IVOR (GLOVE) ×3 IMPLANT
GOWN SPEC L3 XXLG W/TWL (GOWN DISPOSABLE) ×3 IMPLANT
GOWN STRL REUS W/ TWL LRG LVL3 (GOWN DISPOSABLE) IMPLANT
GOWN STRL REUS W/TWL LRG LVL3 (GOWN DISPOSABLE) ×3
GOWN STRL REUS W/TWL XL LVL3 (GOWN DISPOSABLE) ×4 IMPLANT
HANDPIECE INTERPULSE COAX TIP (DISPOSABLE) ×6
HOLDER FOLEY CATH W/STRAP (MISCELLANEOUS) ×3 IMPLANT
HOOD PEEL AWAY FLYTE STAYCOOL (MISCELLANEOUS) ×6 IMPLANT
LIQUID BAND (GAUZE/BANDAGES/DRESSINGS) ×4 IMPLANT
MARKER SKIN DUAL TIP RULER LAB (MISCELLANEOUS) ×3 IMPLANT
NDL SPNL 18GX3.5 QUINCKE PK (NEEDLE) ×1 IMPLANT
NEEDLE SPNL 18GX3.5 QUINCKE PK (NEEDLE) ×3 IMPLANT
PACK ANTERIOR HIP CUSTOM (KITS) ×3 IMPLANT
SAW OSC TIP CART 19.5X105X1.3 (SAW) ×3 IMPLANT
SEALER BIPOLAR AQUA 6.0 (INSTRUMENTS) ×3 IMPLANT
SET HNDPC FAN SPRY TIP SCT (DISPOSABLE) ×2 IMPLANT
SOL PREP POV-IOD 4OZ 10% (MISCELLANEOUS) ×3 IMPLANT
SUT ETHIBOND NAB CT1 #1 30IN (SUTURE) ×6 IMPLANT
SUT MNCRL AB 3-0 PS2 18 (SUTURE) ×3 IMPLANT
SUT MON AB 2-0 CT1 36 (SUTURE) ×6 IMPLANT
SUT VIC AB 1 CT1 36 (SUTURE) ×3 IMPLANT
SUT VIC AB 2-0 CT1 27 (SUTURE) ×3
SUT VIC AB 2-0 CT1 TAPERPNT 27 (SUTURE) ×1 IMPLANT
SUT VLOC 180 0 24IN GS25 (SUTURE) ×3 IMPLANT
SYR 50ML LL SCALE MARK (SYRINGE) ×3 IMPLANT
TRAY FOLEY W/METER SILVER 14FR (SET/KITS/TRAYS/PACK) ×2 IMPLANT
YANKAUER SUCT BULB TIP 10FT TU (MISCELLANEOUS) ×3 IMPLANT

## 2015-09-21 NOTE — Interval H&P Note (Signed)
History and Physical Interval Note:  09/21/2015 12:33 PM  Jean Hudson  has presented today for surgery, with the diagnosis of STRESS FRACTURE LEFT HIP  The various methods of treatment have been discussed with the patient and family. After consideration of risks, benefits and other options for treatment, the patient has consented to  Procedure(s): LEFT TOTAL HIP ARTHROPLASTY ANTERIOR APPROACH (Left) as a surgical intervention .  The patient's history has been reviewed, patient examined, no change in status, stable for surgery.  I have reviewed the patient's chart and labs.  Questions were answered to the patient's satisfaction.     Dehlia Kilner, Horald Pollen

## 2015-09-21 NOTE — H&P (View-Only) (Signed)
TOTAL HIP ADMISSION H&P  Patient is admitted for left total hip arthroplasty.  Subjective:  Chief Complaint: left hip pain  HPI: Jean Hudson, 65 y.o. female, has a history of pain and functional disability in the left hip(s) due to arthritis and collapsed subchondral fracture and patient has failed non-surgical conservative treatments for greater than 12 weeks to include NSAID's and/or analgesics, flexibility and strengthening excercises, use of assistive devices, weight reduction as appropriate and activity modification.  Onset of symptoms was abrupt starting 1 years ago with rapidlly worsening course since that time.The patient noted no past surgery on the left hip(s).  Patient currently rates pain in the left hip at 10 out of 10 with activity. Patient has night pain, worsening of pain with activity and weight bearing, pain that interfers with activities of daily living and pain with passive range of motion. Patient has evidence of subchondral sclerosis, joint space narrowing and subchondral fracture (MRI) by imaging studies. This condition presents safety issues increasing the risk of falls.  There is no current active infection.  Patient Active Problem List   Diagnosis Date Noted  . HLD (hyperlipidemia) 08/10/2015  . Former smoker, stopped smoking in distant past 08/10/2015  . Family history of heart disease 04/19/2014  . Pain in shoulder 04/19/2014   Past Medical History  Diagnosis Date  . Asthma   . Seasonal allergies   . Rotator cuff tear, right 08/2011  . SLAP lesion of shoulder 08/2011    right  . Hyperlipidemia     Past Surgical History  Procedure Laterality Date  . Dorsal compartment release  12/12/2005    right 1st dorsal compartment  . Trigger finger release  07/23/2011    Procedure: RELEASE TRIGGER FINGER/A-1 PULLEY;  Surgeon: Cammie Sickle., MD;  Location: [place];  Service: Orthopedics;  Laterality: Left;  left thumb  . Dorsal compartment release   07/23/2011    Procedure: RELEASE DORSAL COMPARTMENT (DEQUERVAIN);  Surgeon: Cammie Sickle., MD;  Location: [facility];  Service: Orthopedics;  Laterality: Left;  1st left dorsal compartment  . Back surgery  1988  . Shoulder arthroscopy  2/13    rt rcr  . Tubal ligation    . Colonoscopy    . Shoulder arthroscopy with rotator cuff repair and subacromial decompression  06/02/2012    Procedure: SHOULDER ARTHROSCOPY WITH ROTATOR CUFF REPAIR AND SUBACROMIAL DECOMPRESSION;  Surgeon: Cammie Sickle., MD;  Location: [place];  Service: Orthopedics;  Laterality: Left;  LEFT SHOULDER ARTHROSCOPY WITH SUBACROMIAL DECOMPRESSION, DISTAL CLAVICLE RESECTION AND REPAIR ROTATOR CUFF  . Trigger finger release Left 04/06/2015    Procedure: RELEASE A-1 PULLEY LEFT LONG AND LEFT RING FINGERS;  Surgeon: Leanora Cover, MD;  Location: [place];  Service: Orthopedics;  Laterality: Left;     (Not in a hospital admission) Allergies  Allergen Reactions  . Tramadol Other (See Comments)    "makes me dizzy"  . Demerol Nausea And Vomiting  . Flagyl [Metronidazole Hcl] Swelling and Rash    JOINT SWELLING  . Hydrocodone Itching    Social History  Substance Use Topics  . Smoking status: Former Smoker    Quit date: 07/16/1991  . Smokeless tobacco: Never Used  . Alcohol Use: No    Family History  Problem Relation Age of Onset  . Anesthesia problems Father     very agitated waking up post-op  . Alzheimer's disease Father   . Hyperlipidemia Father   .  Heart attack Father   . Heart attack Mother   . Heart disease Mother   . Hypertension Mother   . Melanoma Mother   . Stroke Mother      Review of Systems  Constitutional: Negative.   HENT: Negative for congestion, ear discharge, ear pain, hearing loss, nosebleeds, sore throat and tinnitus.   Eyes: Negative.   Respiratory: Negative.  Negative for stridor.   Cardiovascular: Negative.   Gastrointestinal:  Negative.   Genitourinary: Negative.   Musculoskeletal: Positive for back pain and joint pain.  Skin: Negative.   Neurological: Positive for headaches. Negative for dizziness, tingling, tremors, sensory change, speech change, focal weakness, seizures and loss of consciousness.  Endo/Heme/Allergies: Negative.   Psychiatric/Behavioral: Negative.     Objective:  Physical Exam  Vitals reviewed. Constitutional: She is oriented to person, place, and time. She appears well-developed and well-nourished.  HENT:  Head: Normocephalic and atraumatic.  Eyes: Conjunctivae and EOM are normal. Pupils are equal, round, and reactive to light.  Neck: Normal range of motion. Neck supple.  Cardiovascular: Normal rate, regular rhythm and intact distal pulses.   Respiratory: Effort normal and breath sounds normal. No respiratory distress.  GI: Soft. Bowel sounds are normal. She exhibits no distension.  Genitourinary:  deferred  Musculoskeletal:       Left hip: She exhibits decreased range of motion and crepitus.  NVI  Neurological: She is alert and oriented to person, place, and time. She has normal reflexes.  Skin: Skin is warm and dry.  Psychiatric: She has a normal mood and affect. Her behavior is normal. Judgment and thought content normal.    Vital signs in last 24 hours: @VSRANGES @  Labs:   Estimated body mass index is 24.81 kg/(m^2) as calculated from the following:   Height as of 08/10/15: 5\' 5"  (1.651 m).   Weight as of 08/10/15: 67.64 kg (149 lb 1.9 oz).   Imaging Review Plain radiographs demonstrate moderate degenerative joint disease of the left hip(s). The bone quality appears to be adequate for age and reported activity level.  Assessment/Plan:  End stage arthritis, left hip with collapsed subchondral fracture  The patient history, physical examination, clinical judgement of the provider and imaging studies are consistent with end stage degenerative joint disease of the left  hip(s) and total hip arthroplasty is deemed medically necessary. The treatment options including medical management, injection therapy, arthroscopy and arthroplasty were discussed at length. The risks and benefits of total hip arthroplasty were presented and reviewed. The risks due to aseptic loosening, infection, stiffness, dislocation/subluxation,  thromboembolic complications and other imponderables were discussed.  The patient acknowledged the explanation, agreed to proceed with the plan and consent was signed. Patient is being admitted for inpatient treatment for surgery, pain control, PT, OT, prophylactic antibiotics, VTE prophylaxis, progressive ambulation and ADL's and discharge planning.The patient is planning to be discharged home with home health services

## 2015-09-21 NOTE — Discharge Summary (Signed)
Physician Discharge Summary  Patient ID: Jean Hudson MRN: [id number] DOB/AGE: Aug 05, 1950 65 y.o.  Admit date: 09/21/2015 Discharge date: 09/22/2015  Admission Diagnoses:  Osteoarthritis of left hip  Discharge Diagnoses:  Principal Problem:   Osteoarthritis of left hip Active Problems:   Stress fracture of hip with nonunion   Past Medical History  Diagnosis Date  . Asthma   . Seasonal allergies   . Rotator cuff tear, right 08/2011  . SLAP lesion of shoulder 08/2011    right  . Hyperlipidemia   . Family history of adverse reaction to anesthesia     pts father agitated following anesthesia;   . History of bronchitis   . Shortness of breath dyspnea     pt states gets SOB if does not use Advair regularly  . Wears glasses   . GERD (gastroesophageal reflux disease)   . Arthritis   . Rotator cuff tear     left 05/2014  . Colon polyp   . Scoliosis   . Spondylosis of lumbar joint   . Hypertension     Surgeries: Procedure(s): LEFT TOTAL HIP ARTHROPLASTY ANTERIOR APPROACH on 09/21/2015   Consultants (if any):    Discharged Condition: Improved  Hospital Course: Jean Hudson is an 65 y.o. female who was admitted 09/21/2015 with a diagnosis of Osteoarthritis of left hip and went to the operating room on 09/21/2015 and underwent the above named procedures.    She was given perioperative antibiotics:      Anti-infectives    Start     Dose/Rate Route Frequency Ordered Stop   09/21/15 2000  ceFAZolin (ANCEF) IVPB 1 g/50 mL premix     1 g 100 mL/hr over 30 Minutes Intravenous Every 6 hours 09/21/15 1725 09/22/15 0355   09/21/15 1049  ceFAZolin (ANCEF) IVPB 2 g/50 mL premix     2 g 100 mL/hr over 30 Minutes Intravenous On call to O.R. 09/21/15 1049 09/21/15 1326    .  She was given sequential compression devices, early ambulation, and ASA for DVT prophylaxis.  She benefited maximally from the hospital stay and there were no complications.    Recent vital signs:  Filed  Vitals:   09/22/15 0949 09/22/15 1350  BP: 140/63 123/51  Pulse: 72 75  Temp: 98.3 F (36.8 C) 99.7 F (37.6 C)  Resp: 16 16    Recent laboratory studies:  Lab Results  Component Value Date   HGB 11.1* 09/22/2015   HGB 14.5 09/13/2015   HGB 14.7 06/02/2012   Lab Results  Component Value Date   WBC 13.2* 09/22/2015   PLT 239 09/22/2015   Lab Results  Component Value Date   INR 0.92 09/13/2015   Lab Results  Component Value Date   NA 141 09/22/2015   K 3.5 09/22/2015   CL 107 09/22/2015   CO2 24 09/22/2015   BUN 10 09/22/2015   CREATININE 0.68 09/22/2015   GLUCOSE 167* 09/22/2015    Discharge Medications:     Medication List    STOP taking these medications        acetaminophen 650 MG CR tablet  Commonly known as:  TYLENOL      TAKE these medications        albuterol 108 (90 Base) MCG/ACT inhaler  Commonly known as:  PROVENTIL HFA;VENTOLIN HFA  Inhale 2 puffs into the lungs every 4 (four) hours as needed for wheezing or shortness of breath. Reported on 08/10/2015     aspirin EC 325 MG  tablet  Take 1 tablet (325 mg total) by mouth 2 (two) times daily after a meal.     atorvastatin 10 MG tablet  Commonly known as:  LIPITOR  TAKE 1 TABLET BY MOUTH DAILY     cloNIDine 0.1 MG tablet  Commonly known as:  CATAPRES  Take 0.1 mg by mouth 2 (two) times daily.     cyclobenzaprine 10 MG tablet  Commonly known as:  FLEXERIL  Take 10 mg by mouth 3 (three) times daily as needed. (muscle spasms)     docusate sodium 100 MG capsule  Commonly known as:  COLACE  Take 1 capsule (100 mg total) by mouth 2 (two) times daily.     fluocinonide 0.05 % external solution  Commonly known as:  LIDEX  Apply 1 application topically daily.     Fluticasone-Salmeterol 250-50 MCG/DOSE Aepb  Commonly known as:  ADVAIR  Inhale 1 puff into the lungs 2 (two) times daily.     HYDROcodone-acetaminophen 5-325 MG tablet  Commonly known as:  NORCO  Take 1-2 tablets by mouth every 4  (four) hours as needed for moderate pain.     meloxicam 15 MG tablet  Commonly known as:  MOBIC  Take 15 mg by mouth daily.     montelukast 10 MG tablet  Commonly known as:  SINGULAIR  Take 10 mg by mouth at bedtime.     ondansetron 4 MG tablet  Commonly known as:  ZOFRAN  Take 1 tablet (4 mg total) by mouth every 6 (six) hours as needed for nausea.     senna 8.6 MG Tabs tablet  Commonly known as:  SENOKOT  Take 2 tablets (17.2 mg total) by mouth at bedtime.        Diagnostic Studies: Dg C-arm 61-120 Min-no Report  09/21/2015  CLINICAL DATA: surgery C-ARM 61-120 MINUTES Fluoroscopy was utilized by the requesting physician.  No radiographic interpretation.   Dg Hip Operative Unilat With Pelvis Left  09/21/2015  CLINICAL DATA:  Imaging during left hip arthroplasty. EXAM: OPERATIVE LEFT HIP (WITH PELVIS IF PERFORMED) 2 VIEWS TECHNIQUE: Fluoroscopic spot image(s) were submitted for interpretation post-operatively. 0.4 minutes of fluoroscopy. COMPARISON:  None. FINDINGS: The femoral and acetabular components of the left hip total arthroplasty are well-seated and aligned. There is no acute fracture or evidence of an operative complication. IMPRESSION: Well-aligned left hip total arthroplasty. Electronically Signed   By: Lajean Manes M.D.   On: 09/21/2015 15:13    Disposition: 01-Home or Self Care  Discharge Instructions    Call MD / Call 911    Complete by:  As directed   If you experience chest pain or shortness of breath, CALL 911 and be transported to the hospital emergency room.  If you develope a fever above 101 F, pus (white drainage) or increased drainage or redness at the wound, or calf pain, call your surgeon's office.     Constipation Prevention    Complete by:  As directed   Drink plenty of fluids.  Prune juice may be helpful.  You may use a stool softener, such as Colace (over the counter) 100 mg twice a day.  Use MiraLax (over the counter) for constipation as needed.      Diet - low sodium heart healthy    Complete by:  As directed      Driving restrictions    Complete by:  As directed   No driving for 6 weeks     Increase activity slowly as tolerated  Complete by:  As directed      Lifting restrictions    Complete by:  As directed   No lifting for 6 weeks     TED hose    Complete by:  As directed   Use stockings (TED hose) for 2 weeks on both leg(s).  You may remove them at night for sleeping.           Follow-up Information    Follow up with Tikesha Mort, Horald Pollen, MD. Schedule an appointment as soon as possible for a visit in 2 weeks.   Specialty:  Orthopedic Surgery   Why:  For wound re-check   Contact information:   [place] [place] [address] [phone]       Follow up with [facility].   Why:  HHPT   Contact information:   [address] [place] [address] [phone]       Follow up with [facility].   Contact information:   [address] [place] [address] [phone]        Signed: Elie Goody 09/24/2015, 3:34 PM

## 2015-09-21 NOTE — Anesthesia Procedure Notes (Signed)
Procedure Name: Intubation Date/Time: 09/21/2015 1:25 PM Performed by: Glory Buff Pre-anesthesia Checklist: Patient identified, Emergency Drugs available, Suction available, Patient being monitored and Timeout performed Patient Re-evaluated:Patient Re-evaluated prior to inductionOxygen Delivery Method: Circle system utilized Preoxygenation: Pre-oxygenation with 100% oxygen Intubation Type: IV induction Ventilation: Mask ventilation without difficulty Laryngoscope Size: Mac and 3 Grade View: Grade I Tube type: Oral Tube size: 7.0 mm Number of attempts: 1 Airway Equipment and Method: Stylet Placement Confirmation: ETT inserted through vocal cords under direct vision,  positive ETCO2,  CO2 detector and breath sounds checked- equal and bilateral Secured at: 21 cm Tube secured with: Tape Dental Injury: Teeth and Oropharynx as per pre-operative assessment

## 2015-09-21 NOTE — Transfer of Care (Signed)
Immediate Anesthesia Transfer of Care Note  Patient: Jean Hudson  Procedure(s) Performed: Procedure(s): LEFT TOTAL HIP ARTHROPLASTY ANTERIOR APPROACH (Left)  Patient Location: PACU  Anesthesia Type:General  Level of Consciousness: awake, alert  and oriented  Airway & Oxygen Therapy: Patient Spontanous Breathing and Patient connected to face mask oxygen  Post-op Assessment: Report given to RN and Post -op Vital signs reviewed and stable  Post vital signs: Reviewed and stable  Last Vitals:  Filed Vitals:   09/21/15 1043  BP: 130/66  Pulse: 75  Temp: 36.6 C  Resp: 18    Complications: No apparent anesthesia complications

## 2015-09-21 NOTE — Discharge Instructions (Signed)
°Dr. Nivek Powley °Joint Replacement Specialist °[facility] °[address] °[place], [place] [address] °[phone] ° ° °TOTAL HIP REPLACEMENT POSTOPERATIVE DIRECTIONS ° ° ° °Hip Rehabilitation, Guidelines Following Surgery  ° °WEIGHT BEARING °Weight bearing as tolerated with assist device (walker, cane, etc) as directed, use it as long as suggested by your surgeon or therapist, typically at least 4-6 weeks. ° °The results of a hip operation are greatly improved after range of motion and muscle strengthening exercises. Follow all safety measures which are given to protect your hip. If any of these exercises cause increased pain or swelling in your joint, decrease the amount until you are comfortable again. Then slowly increase the exercises. Call your caregiver if you have problems or questions.  ° °HOME CARE INSTRUCTIONS  °Most of the following instructions are designed to prevent the dislocation of your new hip.  °Remove items at home which could result in a fall. This includes throw rugs or furniture in walking pathways.  °Continue medications as instructed at time of discharge. °· You may have some home medications which will be placed on hold until you complete the course of blood thinner medication. °· You may start showering once you are discharged home. Do not remove your dressing. °Do not put on socks or shoes without following the instructions of your caregivers.   °Sit on chairs with arms. Use the chair arms to help push yourself up when arising.  °Arrange for the use of a toilet seat elevator so you are not sitting low.  °· Walk with walker as instructed.  °You may resume a sexual relationship in one month or when given the OK by your caregiver.  °Use walker as long as suggested by your caregivers.  °You may put full weight on your legs and walk as much as is comfortable. °Avoid periods of inactivity such as sitting longer than an hour when not asleep. This helps prevent  blood clots.  °You may return to work once you are cleared by your surgeon.  °Do not drive a car for 6 weeks or until released by your surgeon.  °Do not drive while taking narcotics.  °Wear elastic stockings for two weeks following surgery during the day but you may remove then at night.  °Make sure you keep all of your appointments after your operation with all of your doctors and caregivers. You should call the office at the above phone number and make an appointment for approximately two weeks after the date of your surgery. °Please pick up a stool softener and laxative for home use as long as you are requiring pain medications. °· ICE to the affected hip every three hours for 30 minutes at a time and then as needed for pain and swelling. Continue to use ice on the hip for pain and swelling from surgery. You may notice swelling that will progress down to the foot and ankle.  This is normal after surgery.  Elevate the leg when you are not up walking on it.   °It is important for you to complete the blood thinner medication as prescribed by your doctor. °· Continue to use the breathing machine which will help keep your temperature down.  It is common for your temperature to cycle up and down following surgery, especially at night when you are not up moving around and exerting yourself.  The breathing machine keeps your lungs expanded and your temperature down. ° °RANGE OF MOTION AND STRENGTHENING EXERCISES  °These exercises are   designed to help you keep full movement of your hip joint. Follow your caregiver's or physical therapist's instructions. Perform all exercises about fifteen times, three times per day or as directed. Exercise both hips, even if you have had only one joint replacement. These exercises can be done on a training (exercise) mat, on the floor, on a table or on a bed. Use whatever works the best and is most comfortable for you. Use music or television while you are exercising so that the exercises  are a pleasant break in your day. This will make your life better with the exercises acting as a break in routine you can look forward to.  °Lying on your back, slowly slide your foot toward your buttocks, raising your knee up off the floor. Then slowly slide your foot back down until your leg is straight again.  °Lying on your back spread your legs as far apart as you can without causing discomfort.  °Lying on your side, raise your upper leg and foot straight up from the floor as far as is comfortable. Slowly lower the leg and repeat.  °Lying on your back, tighten up the muscle in the front of your thigh (quadriceps muscles). You can do this by keeping your leg straight and trying to raise your heel off the floor. This helps strengthen the largest muscle supporting your knee.  °Lying on your back, tighten up the muscles of your buttocks both with the legs straight and with the knee bent at a comfortable angle while keeping your heel on the floor.  ° °SKILLED REHAB INSTRUCTIONS: °If the patient is transferred to a skilled rehab facility following release from the hospital, a list of the current medications will be sent to the facility for the patient to continue.  When discharged from the skilled rehab facility, please have the facility set up the patient's Home Health Physical Therapy prior to being released. Also, the skilled facility will be responsible for providing the patient with their medications at time of release from the facility to include their pain medication and their blood thinner medication. If the patient is still at the rehab facility at time of the two week follow up appointment, the skilled rehab facility will also need to assist the patient in arranging follow up appointment in our office and any transportation needs. ° °MAKE SURE YOU:  °Understand these instructions.  °Will watch your condition.  °Will get help right away if you are not doing well or get worse. ° °Pick up stool softner and  laxative for home use following surgery while on pain medications. °Do not remove your dressing. °The dressing is waterproof--it is OK to take showers. °Continue to use ice for pain and swelling after surgery. °Do not use any lotions or creams on the incision until instructed by your surgeon. °Total Hip Protocol. ° ° °

## 2015-09-21 NOTE — Op Note (Signed)
OPERATIVE REPORT  SURGEON: Rod Can, MD   ASSISTANT: Nehemiah Massed, PA-C.  PREOPERATIVE DIAGNOSIS: Collapsed subchondral fracture, Left hip.   POSTOPERATIVE DIAGNOSIS: Collapsed subchondral fracture, Left hip.   PROCEDURE: Left total hip arthroplasty, anterior approach.   IMPLANTS: DePuy Tri Lock stem, size 5, std offset. DePuy Pinnacle Cup, size 52 mm. DePuy Altrx liner, size 52 by 32 mm, neutral. DePuy Biolox ceramic head ball, size 32 + 5 mm.  ANESTHESIA:  General  ESTIMATED BLOOD LOSS: 271mL.  ANTIBIOTICS: 2 g ancef.  DRAINS: None.  COMPLICATIONS: None.   CONDITION: PACU - hemodynamically stable.Marland Kitchen   BRIEF CLINICAL NOTE: Jean Hudson is a 65 y.o. female with a left hip collapsed subchondral fracture with extensive marrow edema. After failing conservative management, the patient was indicated for total hip arthroplasty. The risks, benefits, and alternatives to the procedure were explained, and the patient elected to proceed.  PROCEDURE IN DETAIL: Surgical site was marked by myself. Spinal anesthesia was obtained in the pre-op holding area. Once inside the operative room, a foley catheter was inserted. The patient was then positioned on the Hana table. All bony prominences were well padded. The hip was prepped and draped in the normal sterile surgical fashion. A time-out was called verifying side and site of surgery. The patient received IV antibiotics within 60 minutes of beginning the procedure.  The direct anterior approach to the hip was performed through the Hueter interval. Lateral femoral circumflex vessels were treated with the Auqumantys. The anterior capsule was exposed and an inverted T capsulotomy was made.The femoral neck cut was made to the level of the templated cut. A corkscrew was placed into the head and the head was removed. The head was passed to the back table and was measured.  Acetabular exposure was achieved, and the pulvinar and  labrum were excised. Sequental reaming of the acetabulum was then performed up to a size 51 mm reamer. A 52 mm cup was then opened and impacted into place at approximately 40 degrees of abduction and 20 degrees of anteversion. The final polyethylene liner was impacted into place and acetabular osteophytes were removed.   I then gained femoral exposure taking care to protect the abductors and greater trochanter. This was performed using standard external rotation, extension, and adduction. The capsule was peeled off the inner aspect of the greater trochanter, taking care to preserve the short external rotators. A cookie cutter was used to enter the femoral canal, and then the femoral canal finder was placed. Sequential broaching was performed up to a size 5. Calcar planer was used on the femoral neck remnant. I placed a std offset neck and a trial head ball. The hip was reduced. Leg lengths and offset were checked fluoroscopically. The hip was dislocated and trial components were removed. The final implants were placed, and the hip was reduced.  Fluoroscopy was used to confirm component position and leg lengths. At 90 degrees of external rotation and full extension, the hip was stable to an anterior directed force.  The wound was copiously irrigated with a dilute betadine solution followed by normal saline. Marcaine solution was injected into the periarticular soft tissue. The wound was closed in layers using #1 Vicryl and V-Loc for the fascia, 2-0 Vicryl for the subcutaneous fat, 2-0 Monocryl for the deep dermal layer, 3-0 running Monocryl subcuticular stitch, and Dermabond for the skin. Once the glue was fully dried, an Aquacell Ag dressing was applied. The patient was transported to the recovery room in stable  condition. Sponge, needle, and instrument counts were correct at the end of the case x2. The patient tolerated the procedure well and there were no known complications.  Please note  that a surgical assistant was a medical necessity for this procedure to perform it in a safe and expeditious manner. Assistant was necessary to provide appropriate retraction of vital neurovascular structures, to prevent femoral fracture, and to allow for anatomic placement of the prosthesis.

## 2015-09-21 NOTE — Anesthesia Preprocedure Evaluation (Addendum)
Anesthesia Evaluation  Patient identified by MRN, date of birth, ID band Patient awake    Reviewed: Allergy & Precautions, NPO status , Patient's Chart, lab work & pertinent test results  Airway Mallampati: II  TM Distance: >3 FB Neck ROM: Full    Dental no notable dental hx.    Pulmonary asthma , former smoker,    Pulmonary exam normal breath sounds clear to auscultation       Cardiovascular hypertension, Pt. on medications Normal cardiovascular exam Rhythm:Regular Rate:Normal     Neuro/Psych negative neurological ROS  negative psych ROS   GI/Hepatic negative GI ROS, Neg liver ROS,   Endo/Other  negative endocrine ROS  Renal/GU negative Renal ROS  negative genitourinary   Musculoskeletal negative musculoskeletal ROS (+)   Abdominal   Peds negative pediatric ROS (+)  Hematology negative hematology ROS (+)   Anesthesia Other Findings   Reproductive/Obstetrics negative OB ROS                            Anesthesia Physical Anesthesia Plan  ASA: II  Anesthesia Plan: General   Post-op Pain Management:    Induction: Intravenous  Airway Management Planned: Oral ETT  Additional Equipment:   Intra-op Plan:   Post-operative Plan: Extubation in OR  Informed Consent: I have reviewed the patients History and Physical, chart, labs and discussed the procedure including the risks, benefits and alternatives for the proposed anesthesia with the patient or authorized representative who has indicated his/her understanding and acceptance.   Dental advisory given  Plan Discussed with: CRNA and Surgeon  Anesthesia Plan Comments:         Anesthesia Quick Evaluation

## 2015-09-22 ENCOUNTER — Encounter (HOSPITAL_COMMUNITY): Payer: Self-pay | Admitting: Orthopedic Surgery

## 2015-09-22 LAB — BASIC METABOLIC PANEL
ANION GAP: 10 (ref 5–15)
BUN: 10 mg/dL (ref 6–20)
CHLORIDE: 107 mmol/L (ref 101–111)
CO2: 24 mmol/L (ref 22–32)
Calcium: 8.5 mg/dL — ABNORMAL LOW (ref 8.9–10.3)
Creatinine, Ser: 0.68 mg/dL (ref 0.44–1.00)
GFR calc Af Amer: >60 mL/min (ref >60)
Glucose, Bld: 167 mg/dL — ABNORMAL HIGH (ref 65–99)
POTASSIUM: 3.5 mmol/L (ref 3.5–5.1)
SODIUM: 141 mmol/L (ref 135–145)

## 2015-09-22 LAB — CBC
HCT: 31.2 % — ABNORMAL LOW (ref 36.0–46.0)
HEMOGLOBIN: 11.1 g/dL — AB (ref 12.0–15.0)
MCH: 32 pg (ref 26.0–34.0)
MCHC: 35.6 g/dL (ref 30.0–36.0)
MCV: 89.9 fL (ref 78.0–100.0)
PLATELETS: 239 10*3/uL (ref 150–400)
RBC: 3.47 MIL/uL — ABNORMAL LOW (ref 3.87–5.11)
RDW: 11.6 % (ref 11.5–15.5)
WBC: 13.2 10*3/uL — ABNORMAL HIGH (ref 4.0–10.5)

## 2015-09-22 MED ORDER — HYDROCODONE-ACETAMINOPHEN 5-325 MG PO TABS: 1.0000 | ORAL_TABLET | ORAL | Status: DC | PRN

## 2015-09-22 MED ORDER — SENNA 8.6 MG PO TABS
2.0000 | ORAL_TABLET | Freq: Every day | ORAL | Status: DC
Start: 2015-09-22 — End: 2019-04-28

## 2015-09-22 MED ORDER — ONDANSETRON HCL 4 MG PO TABS: 4.0000 mg | ORAL_TABLET | Freq: Four times a day (QID) | ORAL | Status: DC | PRN

## 2015-09-22 MED ORDER — ASPIRIN EC 325 MG PO TBEC: 325.0000 mg | DELAYED_RELEASE_TABLET | Freq: Two times a day (BID) | ORAL | Status: DC

## 2015-09-22 MED ORDER — DOCUSATE SODIUM 100 MG PO CAPS: 100.0000 mg | ORAL_CAPSULE | Freq: Two times a day (BID) | ORAL | Status: DC

## 2015-09-22 NOTE — Anesthesia Postprocedure Evaluation (Signed)
Anesthesia Post Note  Patient: Jean Hudson  Procedure(s) Performed: Procedure(s) (LRB): LEFT TOTAL HIP ARTHROPLASTY ANTERIOR APPROACH (Left)  Patient location during evaluation: PACU Anesthesia Type: General Level of consciousness: awake and alert Pain management: pain level controlled Vital Signs Assessment: post-procedure vital signs reviewed and stable Respiratory status: spontaneous breathing, nonlabored ventilation, respiratory function stable and patient connected to nasal cannula oxygen Cardiovascular status: blood pressure returned to baseline and stable Postop Assessment: no signs of nausea or vomiting Anesthetic complications: no    Last Vitals:  Filed Vitals:   09/22/15 0617 09/22/15 0949  BP: 133/61 140/63  Pulse: 75 72  Temp: 36.9 C 36.8 C  Resp: 16 16    Last Pain:  Filed Vitals:   09/22/15 1043  PainSc: 2                  Dontreal Miera S

## 2015-09-22 NOTE — Care Management Note (Signed)
Case Management Note  Patient Details  Name: Jean Hudson MRN: [id number] Date of Birth: 1950-10-14  Subjective/Objective:  jPatient states she has all dme needed-has rw, has raised toilet seat.[facility] rep Audrea Muscat already following for HHPT if needed-await HHPT order if needed.                  Action/Plan:d/c home w/HHC.   Expected Discharge Date:                  Expected Discharge Plan:  [place]  In-House Referral:     Discharge planning Services  CM Consult  Post Acute Care Choice:    Choice offered to:  Patient  DME Arranged:    DME Agency:     HH Arranged:  PT HH Agency:  [facility]  Status of Service:  Completed, signed off  Medicare Important Message Given:    Date Medicare IM Given:    Medicare IM give by:    Date Additional Medicare IM Given:    Additional Medicare Important Message give by:     If discussed at [place] of Stay Meetings, dates discussed:    Additional Comments:  Dessa Phi, RN 09/22/2015, 12:06 PM

## 2015-09-22 NOTE — Progress Notes (Signed)
   Subjective:  Patient reports pain as mild to moderate.  No c/o. Denies N/V/CP/SOB.  Objective:   VITALS:   Filed Vitals:   09/21/15 2102 09/21/15 2136 09/22/15 0107 09/22/15 0617  BP: 134/68 132/64 115/57 133/61  Pulse: 78 77 66 75  Temp: 97.8 F (36.6 C)  97.7 F (36.5 C) 98.4 F (36.9 C)  TempSrc: Oral  Oral Oral  Resp: 15  16 16   Height:      Weight:      SpO2: 95%  97% 97%    ABD soft Sensation intact distally Intact pulses distally Dorsiflexion/Plantar flexion intact Incision: dressing C/D/I Compartment soft   Lab Results  Component Value Date   WBC 13.2* 09/22/2015   HGB 11.1* 09/22/2015   HCT 31.2* 09/22/2015   MCV 89.9 09/22/2015   PLT 239 09/22/2015   BMET    Component Value Date/Time   NA 141 09/22/2015 0343   K 3.5 09/22/2015 0343   CL 107 09/22/2015 0343   CO2 24 09/22/2015 0343   GLUCOSE 167* 09/22/2015 0343   BUN 10 09/22/2015 0343   CREATININE 0.68 09/22/2015 0343   CALCIUM 8.5* 09/22/2015 0343   GFRNONAA >60 09/22/2015 0343   GFRAA >60 09/22/2015 0343     Assessment/Plan: 1 Day Post-Op   Principal Problem:   Osteoarthritis of left hip Active Problems:   Stress fracture of hip with nonunion   WBAT with walker DVT ppx: ASA, SCDs, TEDs PO pain control PT/OT Dispo: D/C home after clears therapy, today vs tomorrow    Jean Hudson 09/22/2015, 6:23 AM   Rod Can, MD Cell [phone]

## 2015-09-22 NOTE — Evaluation (Signed)
Occupational Therapy Evaluation Patient Details Name: Jean Hudson MRN: [id number] DOB: [birth year] Today's Date: 09/22/2015    History of Present Illness s/p L DA THA    Clinical Impression   This 65 year old female was admitted for the above surgery. All education was completed. No further OT is needed at this time    Follow Up Recommendations  No OT follow up;Supervision/Assistance - 24 hour    Equipment Recommendations  None recommended by OT    Recommendations for Other Services       Precautions / Restrictions Precautions Precautions: Fall Restrictions Weight Bearing Restrictions: No      Mobility Bed Mobility               General bed mobility comments: oob  Transfers Overall transfer level: Needs assistance Equipment used: Rolling walker (2 wheeled) Transfers: Sit to/from Stand Sit to Stand: Min assist         General transfer comment: cues for safety, UE/LE placement    Balance                                            ADL Overall ADL's : Needs assistance/impaired     Grooming: Wash/dry hands;Set up;Supervision/safety;Standing   Upper Body Bathing: Set up;Sitting   Lower Body Bathing: Minimal assistance;Sit to/from stand   Upper Body Dressing : Set up;Sitting   Lower Body Dressing: Maximal assistance;Sit to/from stand   Toilet Transfer: Min guard;Ambulation;Comfort height toilet;BSC--min A for sit to stand   Toileting- Water quality scientist and Hygiene: Min guard;Sit to/from stand         General ADL Comments: pt completed ADL in bathroom--nursing had walked her there.  Pt's husband is putting grab bars by commode and in tub. she is not ready to step in tub yet, and she is a little unsteady.  She plans to sponge bathe for a few days.  Gave her resources for tub seat/bench, if she changes her mind. Also discussed long netted sponge on a stick for increased independence. Husband will assist with socks/shoes.  She  can start pants     Vision     Perception     Praxis      Pertinent Vitals/Pain Pain Assessment: 0-10 Pain Score: 2  Pain Location: L hip Pain Descriptors / Indicators: Tightness Pain Intervention(s): Limited activity within patient's tolerance;Monitored during session;Premedicated before session;Repositioned;Ice applied     Hand Dominance     Extremity/Trunk Assessment Upper Extremity Assessment Upper Extremity Assessment: Overall WFL for tasks assessed           Communication Communication Communication: No difficulties   Cognition Arousal/Alertness: Awake/alert Behavior During Therapy: WFL for tasks assessed/performed Overall Cognitive Status: Within Functional Limits for tasks assessed                     General Comments       Exercises       Shoulder Instructions      Home Living Family/patient expects to be discharged to:: Private residence Living Arrangements: Spouse/significant other Available Help at Discharge: Family               Bathroom Shower/Tub: Tub/shower unit;Curtain Shower/tub characteristics: Architectural technologist: Handicapped height     Home Equipment: Grab bars - toilet;Grab bars - tub/shower          Prior Functioning/Environment Level of  Independence: Independent             OT Diagnosis: Acute pain   OT Problem List:     OT Treatment/Interventions:      OT Goals(Current goals can be found in the care plan section) Acute Rehab OT Goals Patient Stated Goal: get back to being independent OT Goal Formulation: All assessment and education complete, DC therapy  OT Frequency:     Barriers to D/C:            Co-evaluation              End of Session    Activity Tolerance: Patient tolerated treatment well Patient left: in chair;with call bell/phone within reach;with chair alarm set;with family/visitor present   Time: [id number] OT Time Calculation (min): 25 min Charges:  OT General  Charges $OT Visit: 1 Procedure OT Evaluation $OT Eval Low Complexity: 1 Procedure OT Treatments $Self Care/Home Management : 8-22 mins G-Codes:    Junice Fei 2015-10-04, 10:17 AM Lesle Chris, OTR/L [phone] Oct 04, 2015

## 2015-09-22 NOTE — Progress Notes (Signed)
Physical Therapy Treatment Patient Details Name: Jean Hudson MRN: [id number] DOB: September 14, 1950 Today's Date: 09/22/2015    History of Present Illness s/p L DA THA     PT Comments    Pt progressing well and eager for return home.  Reviewed therex, bed mobility, car transfers, and stairs with pt and spouse.    Follow Up Recommendations  Home health PT     Equipment Recommendations  None recommended by PT    Recommendations for Other Services OT consult     Precautions / Restrictions Precautions Precautions: Fall Restrictions Weight Bearing Restrictions: No    Mobility  Bed Mobility Overal bed mobility: Needs Assistance Bed Mobility: Supine to Sit;Sit to Supine     Supine to sit: Min guard;Supervision Sit to supine: Min guard;Supervision   General bed mobility comments: cues for sequence and use of R LE to self assist; repeated x 3  Transfers Overall transfer level: Needs assistance Equipment used: Rolling walker (2 wheeled) Transfers: Sit to/from Stand Sit to Stand: Supervision         General transfer comment: cues for LE management and use of UEs to self assist  Ambulation/Gait Ambulation/Gait assistance: Min guard;Supervision Ambulation Distance (Feet): 450 Feet Assistive device: Rolling walker (2 wheeled) Gait Pattern/deviations: Step-through pattern;Decreased step length - right;Decreased step length - left;Shuffle;Trunk flexed     General Gait Details: cues for posture, position from RW, initial sequence and ER on L   Stairs Stairs: Yes Stairs assistance: Min assist Stair Management: One rail Right;Step to pattern;Forwards;With crutches;With cane Number of Stairs: 8 General stair comments: cues for sequence and foot/cane/crutch placement  Wheelchair Mobility    Modified Rankin (Stroke Patients Only)       Balance                                    Cognition Arousal/Alertness: Awake/alert Behavior During Therapy: WFL  for tasks assessed/performed Overall Cognitive Status: Within Functional Limits for tasks assessed                      Exercises Total Joint Exercises Ankle Circles/Pumps: AROM;Both;15 reps;Supine Quad Sets: AROM;Both;10 reps;Supine Heel Slides: AAROM;Left;20 reps;Supine Hip ABduction/ADduction: AAROM;Left;15 reps;Supine Long Arc Quad: AROM;Left;Seated;15 reps    General Comments        Pertinent Vitals/Pain Pain Assessment: 0-10 Pain Score: 4  Pain Location: L hip Pain Descriptors / Indicators: Aching;Sore Pain Intervention(s): Limited activity within patient's tolerance;Monitored during session;Patient requesting pain meds-RN notified;Ice applied (Pt declined pain meds prior to session)    Home Living                      Prior Function            PT Goals (current goals can now be found in the care plan section) Acute Rehab PT Goals Patient Stated Goal: get back to being independent PT Goal Formulation: With patient Time For Goal Achievement: 09/23/15 Potential to Achieve Goals: Good Progress towards PT goals: Progressing toward goals    Frequency  7X/week    PT Plan Current plan remains appropriate    Co-evaluation             End of Session Equipment Utilized During Treatment: Gait belt Activity Tolerance: Patient tolerated treatment well Patient left: in bed;with call bell/phone within reach     Time: 1441-1528 PT Time Calculation (min) (ACUTE ONLY):  47 min  Charges:  $Gait Training: 8-22 mins $Therapeutic Exercise: 8-22 mins $Therapeutic Activity: 8-22 mins                    G Codes:      Lorain Keast October 07, 2015, 6:21 PM

## 2015-09-22 NOTE — Progress Notes (Signed)
[place] with patient's caregiver in the room and he stated that patient already has a rolling walker at home. He also declined the commode stating that he installed a raised commode in the home.    Linward Headland 09/22/2015, 10:28 AM

## 2015-09-22 NOTE — Evaluation (Signed)
Physical Therapy Evaluation Patient Details Name: Jean Hudson MRN: [id number] DOB: 20-Aug-1950 Today's Date: 09/22/2015   History of Present Illness  s/p L DA THA   Clinical Impression  Pt s/p L THR presents with decreased L LE strength/ROM and post op pain limiting functional mobility.  Pt should progress to dc home with family assist and HHPT follow up.    Follow Up Recommendations Home health PT    Equipment Recommendations  None recommended by PT    Recommendations for Other Services OT consult     Precautions / Restrictions Precautions Precautions: Fall Restrictions Weight Bearing Restrictions: No      Mobility  Bed Mobility               General bed mobility comments: NT OOB wtih nursing  Transfers Overall transfer level: Needs assistance Equipment used: Rolling walker (2 wheeled) Transfers: Sit to/from Stand Sit to Stand: Min guard         General transfer comment: cues for LE management and use of UEs to self assist  Ambulation/Gait Ambulation/Gait assistance: Min guard Ambulation Distance (Feet): 200 Feet Assistive device: Rolling walker (2 wheeled) Gait Pattern/deviations: Step-to pattern;Step-through pattern;Decreased step length - right;Decreased step length - left;Shuffle;Trunk flexed Gait velocity: decr Gait velocity interpretation: Below normal speed for age/gender General Gait Details: cues for posture, position from RW, initial sequence and ER on L  Stairs            Wheelchair Mobility    Modified Rankin (Stroke Patients Only)       Balance                                             Pertinent Vitals/Pain Pain Assessment: 0-10 Pain Score: 2  Pain Location: L hip Pain Descriptors / Indicators: Tightness Pain Intervention(s): Limited activity within patient's tolerance;Monitored during session;Premedicated before session;Ice applied    Home Living Family/patient expects to be discharged to::  Private residence Living Arrangements: Spouse/significant other Available Help at Discharge: Family Type of Home: House Home Access: Stairs to enter Entrance Stairs-Rails: Right Entrance Stairs-Number of Steps: 7 Home Layout: One level Home Equipment: Grab bars - toilet;Grab bars - tub/shower;Walker - 2 wheels      Prior Function Level of Independence: Independent               Hand Dominance        Extremity/Trunk Assessment   Upper Extremity Assessment: Overall WFL for tasks assessed           Lower Extremity Assessment: LLE deficits/detail   LLE Deficits / Details: AAROM at L hip to 90 flex and 20 abd with strength at hip 3-/5  Cervical / Trunk Assessment: Normal  Communication   Communication: No difficulties  Cognition Arousal/Alertness: Awake/alert Behavior During Therapy: WFL for tasks assessed/performed Overall Cognitive Status: Within Functional Limits for tasks assessed                      General Comments      Exercises Total Joint Exercises Ankle Circles/Pumps: AROM;Both;15 reps;Supine Quad Sets: AROM;Both;10 reps;Supine Heel Slides: AAROM;Left;20 reps;Supine Hip ABduction/ADduction: AAROM;Left;15 reps;Supine      Assessment/Plan    PT Assessment Patient needs continued PT services  PT Diagnosis Difficulty walking   PT Problem List Decreased strength;Decreased range of motion;Decreased activity tolerance;Decreased mobility;Decreased knowledge of use of DME;Pain  PT Treatment Interventions DME instruction;Gait training;Stair training;Functional mobility training;Therapeutic activities;Therapeutic exercise;Patient/family education   PT Goals (Current goals can be found in the Care Plan section) Acute Rehab PT Goals Patient Stated Goal: get back to being independent PT Goal Formulation: With patient Time For Goal Achievement: 09/23/15 Potential to Achieve Goals: Good    Frequency 7X/week   Barriers to discharge         Co-evaluation               End of Session Equipment Utilized During Treatment: Gait belt Activity Tolerance: Patient tolerated treatment well Patient left: in chair;with call bell/phone within reach;with family/visitor present;with chair alarm set Nurse Communication: Mobility status         Time: [id number] PT Time Calculation (min) (ACUTE ONLY): 28 min   Charges:   PT Evaluation $PT Eval Low Complexity: 1 Procedure PT Treatments $Therapeutic Exercise: 8-22 mins   PT G Codes:        Paxtyn Boyar 29-Sep-2015, 12:54 PM

## 2015-09-24 DIAGNOSIS — J45909 Unspecified asthma, uncomplicated: Secondary | ICD-10-CM | POA: Diagnosis not present

## 2015-09-24 DIAGNOSIS — Z96642 Presence of left artificial hip joint: Secondary | ICD-10-CM | POA: Diagnosis not present

## 2015-09-24 DIAGNOSIS — Z87891 Personal history of nicotine dependence: Secondary | ICD-10-CM | POA: Diagnosis not present

## 2015-09-24 DIAGNOSIS — M84352D Stress fracture, left femur, subsequent encounter for fracture with routine healing: Secondary | ICD-10-CM | POA: Diagnosis not present

## 2015-09-26 DIAGNOSIS — J45909 Unspecified asthma, uncomplicated: Secondary | ICD-10-CM | POA: Diagnosis not present

## 2015-09-26 DIAGNOSIS — Z96642 Presence of left artificial hip joint: Secondary | ICD-10-CM | POA: Diagnosis not present

## 2015-09-26 DIAGNOSIS — Z87891 Personal history of nicotine dependence: Secondary | ICD-10-CM | POA: Diagnosis not present

## 2015-09-26 DIAGNOSIS — M84352D Stress fracture, left femur, subsequent encounter for fracture with routine healing: Secondary | ICD-10-CM | POA: Diagnosis not present

## 2015-09-28 DIAGNOSIS — M84352D Stress fracture, left femur, subsequent encounter for fracture with routine healing: Secondary | ICD-10-CM | POA: Diagnosis not present

## 2015-09-28 DIAGNOSIS — Z96642 Presence of left artificial hip joint: Secondary | ICD-10-CM | POA: Diagnosis not present

## 2015-09-28 DIAGNOSIS — J45909 Unspecified asthma, uncomplicated: Secondary | ICD-10-CM | POA: Diagnosis not present

## 2015-09-28 DIAGNOSIS — Z87891 Personal history of nicotine dependence: Secondary | ICD-10-CM | POA: Diagnosis not present

## 2015-10-02 DIAGNOSIS — J45909 Unspecified asthma, uncomplicated: Secondary | ICD-10-CM | POA: Diagnosis not present

## 2015-10-02 DIAGNOSIS — Z87891 Personal history of nicotine dependence: Secondary | ICD-10-CM | POA: Diagnosis not present

## 2015-10-02 DIAGNOSIS — M84352D Stress fracture, left femur, subsequent encounter for fracture with routine healing: Secondary | ICD-10-CM | POA: Diagnosis not present

## 2015-10-02 DIAGNOSIS — Z96642 Presence of left artificial hip joint: Secondary | ICD-10-CM | POA: Diagnosis not present

## 2015-10-04 DIAGNOSIS — Z471 Aftercare following joint replacement surgery: Secondary | ICD-10-CM | POA: Diagnosis not present

## 2015-10-04 DIAGNOSIS — Z96642 Presence of left artificial hip joint: Secondary | ICD-10-CM | POA: Diagnosis not present

## 2015-10-05 DIAGNOSIS — Z87891 Personal history of nicotine dependence: Secondary | ICD-10-CM | POA: Diagnosis not present

## 2015-10-05 DIAGNOSIS — Z96642 Presence of left artificial hip joint: Secondary | ICD-10-CM | POA: Diagnosis not present

## 2015-10-05 DIAGNOSIS — M84352D Stress fracture, left femur, subsequent encounter for fracture with routine healing: Secondary | ICD-10-CM | POA: Diagnosis not present

## 2015-10-05 DIAGNOSIS — J45909 Unspecified asthma, uncomplicated: Secondary | ICD-10-CM | POA: Diagnosis not present

## 2015-10-13 DIAGNOSIS — Z471 Aftercare following joint replacement surgery: Secondary | ICD-10-CM | POA: Diagnosis not present

## 2015-10-13 DIAGNOSIS — Z96642 Presence of left artificial hip joint: Secondary | ICD-10-CM | POA: Diagnosis not present

## 2015-10-30 DIAGNOSIS — Z96642 Presence of left artificial hip joint: Secondary | ICD-10-CM | POA: Diagnosis not present

## 2015-10-30 DIAGNOSIS — Z471 Aftercare following joint replacement surgery: Secondary | ICD-10-CM | POA: Diagnosis not present

## 2015-11-09 DIAGNOSIS — M1611 Unilateral primary osteoarthritis, right hip: Secondary | ICD-10-CM | POA: Diagnosis not present

## 2015-11-16 ENCOUNTER — Ambulatory Visit: Payer: Self-pay | Admitting: Orthopedic Surgery

## 2015-11-22 DIAGNOSIS — I10 Essential (primary) hypertension: Secondary | ICD-10-CM | POA: Diagnosis not present

## 2015-11-30 DIAGNOSIS — J019 Acute sinusitis, unspecified: Secondary | ICD-10-CM | POA: Diagnosis not present

## 2015-12-13 ENCOUNTER — Ambulatory Visit: Payer: Self-pay | Admitting: Orthopedic Surgery

## 2015-12-13 DIAGNOSIS — R079 Chest pain, unspecified: Secondary | ICD-10-CM | POA: Diagnosis not present

## 2015-12-13 NOTE — H&P (Signed)
TOTAL HIP ADMISSION H&P  Patient is admitted for right total hip arthroplasty.  Subjective:  Chief Complaint: right hip pain  HPI: Jean Hudson, 65 y.o. female, has a history of pain and functional disability in the right hip(s) due to arthritis and patient has failed non-surgical conservative treatments for greater than 12 weeks to include NSAID's and/or analgesics, flexibility and strengthening excercises, use of assistive devices and activity modification.  Onset of symptoms was gradual starting 2 years ago with gradually worsening course since that time.The patient noted no past surgery on the right hip(s).  Patient currently rates pain in the right hip at 10 out of 10 with activity. Patient has night pain, worsening of pain with activity and weight bearing, pain that interfers with activities of daily living and pain with passive range of motion. Patient has evidence of subchondral cysts, subchondral sclerosis, periarticular osteophytes and joint space narrowing by imaging studies. This condition presents safety issues increasing the risk of falls. There is no current active infection.  Patient Active Problem List   Diagnosis Date Noted  . Osteoarthritis of left hip 09/21/2015  . Stress fracture of hip with nonunion 09/21/2015  . HLD (hyperlipidemia) 08/10/2015  . Former smoker, stopped smoking in distant past 08/10/2015  . Family history of heart disease 04/19/2014  . Pain in shoulder 04/19/2014   Past Medical History  Diagnosis Date  . Asthma   . Seasonal allergies   . Rotator cuff tear, right 08/2011  . SLAP lesion of shoulder 08/2011    right  . Hyperlipidemia   . Family history of adverse reaction to anesthesia     pts father agitated following anesthesia;   . History of bronchitis   . Shortness of breath dyspnea     pt states gets SOB if does not use Advair regularly  . Wears glasses   . GERD (gastroesophageal reflux disease)   . Arthritis   . Rotator cuff tear    left 05/2014  . Colon polyp   . Scoliosis   . Spondylosis of lumbar joint   . Hypertension     Past Surgical History  Procedure Laterality Date  . Dorsal compartment release  12/12/2005    right 1st dorsal compartment  . Trigger finger release  07/23/2011    Procedure: RELEASE TRIGGER FINGER/A-1 PULLEY;  Surgeon: Cammie Sickle., MD;  Location: [place];  Service: Orthopedics;  Laterality: Left;  left thumb  . Dorsal compartment release  07/23/2011    Procedure: RELEASE DORSAL COMPARTMENT (DEQUERVAIN);  Surgeon: Cammie Sickle., MD;  Location: [facility];  Service: Orthopedics;  Laterality: Left;  1st left dorsal compartment  . Back surgery  1988  . Shoulder arthroscopy  2/13    rt rcr  . Tubal ligation    . Colonoscopy    . Shoulder arthroscopy with rotator cuff repair and subacromial decompression  06/02/2012    Procedure: SHOULDER ARTHROSCOPY WITH ROTATOR CUFF REPAIR AND SUBACROMIAL DECOMPRESSION;  Surgeon: Cammie Sickle., MD;  Location: [place];  Service: Orthopedics;  Laterality: Left;  LEFT SHOULDER ARTHROSCOPY WITH SUBACROMIAL DECOMPRESSION, DISTAL CLAVICLE RESECTION AND REPAIR ROTATOR CUFF  . Trigger finger release Left 04/06/2015    Procedure: RELEASE A-1 PULLEY LEFT LONG AND LEFT RING FINGERS;  Surgeon: Leanora Cover, MD;  Location: [place];  Service: Orthopedics;  Laterality: Left;  . Cortisone injection    . Total hip arthroplasty Left 09/21/2015    Procedure:  LEFT TOTAL HIP ARTHROPLASTY ANTERIOR APPROACH;  Surgeon: Rod Can, MD;  Location: [facility];  Service: Orthopedics;  Laterality: Left;     (Not in a hospital admission) Allergies  Allergen Reactions  . Tramadol Other (See Comments)    "makes me dizzy"  . Demerol Nausea And Vomiting  . Flagyl [Metronidazole Hcl] Swelling and Rash    JOINT SWELLING  . Hydrocodone Itching    Social History  Substance Use Topics  . Smoking status: Former  Smoker -- 1.50 packs/day for 25 years    Types: Cigarettes    Quit date: 02/13/1992  . Smokeless tobacco: Never Used  . Alcohol Use: No    Family History  Problem Relation Age of Onset  . Anesthesia problems Father     very agitated waking up post-op  . Alzheimer's disease Father   . Hyperlipidemia Father   . Heart attack Father   . Heart attack Mother   . Heart disease Mother   . Hypertension Mother   . Melanoma Mother   . Stroke Mother      Review of Systems  Constitutional: Negative.   Respiratory: Negative.   Cardiovascular: Negative.   Gastrointestinal: Negative.   Genitourinary: Negative.   Musculoskeletal: Positive for back pain and joint pain.  Skin: Negative.   Neurological: Negative.   Endo/Heme/Allergies: Negative.   Psychiatric/Behavioral: Negative.     Objective:  Physical Exam  Vitals reviewed. Constitutional: She is oriented to person, place, and time. She appears well-developed and well-nourished.  HENT:  Head: Normocephalic and atraumatic.  Eyes: Conjunctivae and EOM are normal. Pupils are equal, round, and reactive to light.  Neck: Normal range of motion. Neck supple.  Cardiovascular: Normal rate and regular rhythm.   Respiratory: Effort normal and breath sounds normal. No respiratory distress.  GI: Soft. Bowel sounds are normal. She exhibits no distension.  Genitourinary:  deferred  Musculoskeletal:       Right hip: She exhibits decreased range of motion and crepitus.  Neurological: She is alert and oriented to person, place, and time. She has normal reflexes.  Skin: Skin is warm.  Psychiatric: She has a normal mood and affect. Her behavior is normal. Judgment and thought content normal.    Vital signs in last 24 hours: @VSRANGES @  Labs:   Estimated body mass index is 24.55 kg/(m^2) as calculated from the following:   Height as of 09/21/15: 5\' 5"  (1.651 m).   Weight as of 09/13/15: 66.906 kg (147 lb 8 oz).   Imaging Review Plain  radiographs demonstrate severe degenerative joint disease of the right hip(s). The bone quality appears to be adequate for age and reported activity level.  Assessment/Plan:  End stage arthritis, right hip(s)  The patient history, physical examination, clinical judgement of the provider and imaging studies are consistent with end stage degenerative joint disease of the right hip(s) and total hip arthroplasty is deemed medically necessary. The treatment options including medical management, injection therapy, arthroscopy and arthroplasty were discussed at length. The risks and benefits of total hip arthroplasty were presented and reviewed. The risks due to aseptic loosening, infection, stiffness, dislocation/subluxation,  thromboembolic complications and other imponderables were discussed.  The patient acknowledged the explanation, agreed to proceed with the plan and consent was signed. Patient is being admitted for inpatient treatment for surgery, pain control, PT, OT, prophylactic antibiotics, VTE prophylaxis, progressive ambulation and ADL's and discharge planning.The patient is planning to be discharged home with home health services

## 2015-12-14 ENCOUNTER — Encounter (HOSPITAL_COMMUNITY)
Admission: RE | Admit: 2015-12-14 | Discharge: 2015-12-14 | Disposition: A | Discharge status: Home / Self Care | Payer: Medicare Other | Source: Ambulatory Visit | Attending: Orthopedic Surgery | Admitting: Orthopedic Surgery

## 2015-12-14 DIAGNOSIS — Z87891 Personal history of nicotine dependence: Secondary | ICD-10-CM | POA: Diagnosis not present

## 2015-12-14 DIAGNOSIS — K219 Gastro-esophageal reflux disease without esophagitis: Secondary | ICD-10-CM | POA: Insufficient documentation

## 2015-12-14 DIAGNOSIS — Z96642 Presence of left artificial hip joint: Secondary | ICD-10-CM | POA: Insufficient documentation

## 2015-12-14 DIAGNOSIS — Z0183 Encounter for blood typing: Secondary | ICD-10-CM | POA: Insufficient documentation

## 2015-12-14 DIAGNOSIS — Z79899 Other long term (current) drug therapy: Secondary | ICD-10-CM | POA: Insufficient documentation

## 2015-12-14 DIAGNOSIS — E785 Hyperlipidemia, unspecified: Secondary | ICD-10-CM | POA: Diagnosis not present

## 2015-12-14 DIAGNOSIS — I10 Essential (primary) hypertension: Secondary | ICD-10-CM | POA: Insufficient documentation

## 2015-12-14 DIAGNOSIS — M1611 Unilateral primary osteoarthritis, right hip: Secondary | ICD-10-CM | POA: Diagnosis not present

## 2015-12-14 DIAGNOSIS — Z01818 Encounter for other preprocedural examination: Secondary | ICD-10-CM | POA: Insufficient documentation

## 2015-12-14 DIAGNOSIS — Z01812 Encounter for preprocedural laboratory examination: Secondary | ICD-10-CM | POA: Insufficient documentation

## 2015-12-14 DIAGNOSIS — J45909 Unspecified asthma, uncomplicated: Secondary | ICD-10-CM | POA: Diagnosis not present

## 2015-12-14 LAB — CBC
HCT: 40.3 % (ref 36.0–46.0)
Hemoglobin: 12.9 g/dL (ref 12.0–15.0)
MCH: 28.9 pg (ref 26.0–34.0)
MCHC: 32 g/dL (ref 30.0–36.0)
MCV: 90.4 fL (ref 78.0–100.0)
Platelets: 307 10*3/uL (ref 150–400)
RBC: 4.46 MIL/uL (ref 3.87–5.11)
RDW: 12.6 % (ref 11.5–15.5)
WBC: 7.4 10*3/uL (ref 4.0–10.5)

## 2015-12-14 LAB — BASIC METABOLIC PANEL
Anion gap: 5 (ref 5–15)
BUN: 10 mg/dL (ref 6–20)
CHLORIDE: 106 mmol/L (ref 101–111)
CO2: 28 mmol/L (ref 22–32)
CREATININE: 0.84 mg/dL (ref 0.44–1.00)
Calcium: 9.1 mg/dL (ref 8.9–10.3)
GFR calc non Af Amer: >60 mL/min (ref >60)
GLUCOSE: 97 mg/dL (ref 65–99)
Potassium: 4.1 mmol/L (ref 3.5–5.1)
Sodium: 139 mmol/L (ref 135–145)

## 2015-12-14 LAB — ABO/RH: ABO/RH(D): O POS

## 2015-12-14 LAB — TYPE AND SCREEN
ABO/RH(D): O POS
ANTIBODY SCREEN: NEGATIVE

## 2015-12-14 LAB — SURGICAL PCR SCREEN
MRSA, PCR: NEGATIVE
STAPHYLOCOCCUS AUREUS: NEGATIVE

## 2015-12-14 MED ORDER — CHLORHEXIDINE GLUCONATE 4 % EX LIQD: 60.0000 mL | Freq: Once | CUTANEOUS | Status: DC

## 2015-12-14 NOTE — Pre-Procedure Instructions (Signed)
    Jean Hudson  12/14/2015      [id number] - [place], [place] - [address] AT [address] [place] [address] Phone: [phone] Fax: [phone]    Your procedure is scheduled on December 25, 2015.  Report to [facility] Admitting at 10:30 A.M.  Call this number if you have problems the morning of surgery:  [phone]   Remember:  Do not eat food or drink liquids after midnight.  Take these medicines the morning of surgery with A SIP OF WATER : Fluticasone-Salmeterol (ADVAIR), cloNIDine (CATAPRES)   IF NEEDED:cyclobenzaprine (FLEXERIL), ondansetron (ZOFRAN), albuterol (PROVENTIL HFA;VENTOLIN HFA) -bring with you   STOP NSAID'S like meloxicam (MOBIC) or Ibuprofen, herbal medications one week prior to surgery.   Do not wear jewelry, make-up or nail polish.  Do not wear lotions, powders, or perfumes.  You may wear deodorant.  Do not shave 48 hours prior to surgery.  Men may shave face and neck.  Do not bring valuables to the hospital.  [facility] is not responsible for any belongings or valuables.  Contacts, dentures or bridgework may not be worn into surgery.  Leave your suitcase in the car.  After surgery it may be brought to your room.  For patients admitted to the hospital, discharge time will be determined by your treatment team.  Patients discharged the day of surgery will not be allowed to drive home.   Name and phone number of your driver:    Special instructions:  "Preparing for Surgery"  Please read over the following fact sheets that you were given. Pain Booklet, Coughing and Deep Breathing and Surgical Site Infection Prevention

## 2015-12-15 NOTE — Progress Notes (Addendum)
Anesthesia Chart Review:  Pt is a 65 year old female scheduled for R total hip arthroplasty anterior approach on 12/25/2015 with Dr. Delfino Lovett.   PMH includes:  HTN, hyperlipidemia, asthma, scoliosis, GERD. Former smoker. BMI 24. S/p L THA 09/21/15.   Medications include: albuterol, lipitor, clonidine, advair, losartan  Preoperative labs reviewed.    Chest x-ray 12/13/15 reviewed. Chronic bronchitic changes. No evidence pneumonia, CHF or other acute cardiopulmonary abnormality.   EKG 08/10/15: NSR. LAD  Nuclear stress test 08/16/15:   Nuclear stress EF: 69%.  There was no ST segment deviation noted during stress.  Defect 1: There is a small defect of mild severity present in the mid anteroseptal location likely secondary to breast attenuation. Normal wall motion. No ischemia.  This is a low risk study.  If symptoms worsen or become more worrisome, consider further cardiac testing.  If no changes, I anticipate pt can proceed with surgery as scheduled.   Willeen Cass, FNP-BC [facility]/Anesthesiology Phone: [phone] 12/20/2015 12:58 PM

## 2015-12-18 NOTE — Progress Notes (Signed)
Re-requested last office visit,EKG,CXR,any cardiac testing.

## 2015-12-22 MED ORDER — SODIUM CHLORIDE 0.9 % IV SOLN
1000.0000 mg | INTRAVENOUS | Status: AC
  Administered 2015-12-25: 1000 mg via INTRAVENOUS
  Filled 2015-12-22: qty 10

## 2015-12-24 MED ORDER — CEFAZOLIN SODIUM-DEXTROSE 2-4 GM/100ML-% IV SOLN: 2.0000 g | INTRAVENOUS | Status: DC

## 2015-12-25 ENCOUNTER — Inpatient Hospital Stay (HOSPITAL_COMMUNITY)
Admission: RE | Admit: 2015-12-25 | Discharge: 2015-12-26 | DRG: 470 | Disposition: A | Discharge status: Home / Self Care | Payer: Medicare Other | Source: Ambulatory Visit | Attending: Orthopedic Surgery | Admitting: Orthopedic Surgery

## 2015-12-25 ENCOUNTER — Encounter (HOSPITAL_COMMUNITY)
Admission: RE | Disposition: A | Discharge status: Home / Self Care | Payer: Self-pay | Source: Ambulatory Visit | Attending: Orthopedic Surgery

## 2015-12-25 ENCOUNTER — Inpatient Hospital Stay (HOSPITAL_COMMUNITY): Payer: Medicare Other

## 2015-12-25 ENCOUNTER — Inpatient Hospital Stay (HOSPITAL_COMMUNITY): Payer: Medicare Other | Admitting: Certified Registered"

## 2015-12-25 ENCOUNTER — Encounter (HOSPITAL_COMMUNITY): Payer: Self-pay | Admitting: *Deleted

## 2015-12-25 ENCOUNTER — Inpatient Hospital Stay (HOSPITAL_COMMUNITY): Payer: Medicare Other | Admitting: Emergency Medicine

## 2015-12-25 DIAGNOSIS — Z808 Family history of malignant neoplasm of other organs or systems: Secondary | ICD-10-CM | POA: Diagnosis not present

## 2015-12-25 DIAGNOSIS — K219 Gastro-esophageal reflux disease without esophagitis: Secondary | ICD-10-CM | POA: Diagnosis present

## 2015-12-25 DIAGNOSIS — E785 Hyperlipidemia, unspecified: Secondary | ICD-10-CM | POA: Diagnosis present

## 2015-12-25 DIAGNOSIS — J45909 Unspecified asthma, uncomplicated: Secondary | ICD-10-CM | POA: Diagnosis present

## 2015-12-25 DIAGNOSIS — D62 Acute posthemorrhagic anemia: Secondary | ICD-10-CM | POA: Diagnosis not present

## 2015-12-25 DIAGNOSIS — Z86010 Personal history of colonic polyps: Secondary | ICD-10-CM

## 2015-12-25 DIAGNOSIS — Z96641 Presence of right artificial hip joint: Secondary | ICD-10-CM | POA: Diagnosis not present

## 2015-12-25 DIAGNOSIS — M1990 Unspecified osteoarthritis, unspecified site: Secondary | ICD-10-CM

## 2015-12-25 DIAGNOSIS — Z823 Family history of stroke: Secondary | ICD-10-CM | POA: Diagnosis not present

## 2015-12-25 DIAGNOSIS — Z96642 Presence of left artificial hip joint: Secondary | ICD-10-CM | POA: Diagnosis present

## 2015-12-25 DIAGNOSIS — M47816 Spondylosis without myelopathy or radiculopathy, lumbar region: Secondary | ICD-10-CM | POA: Diagnosis present

## 2015-12-25 DIAGNOSIS — M419 Scoliosis, unspecified: Secondary | ICD-10-CM | POA: Diagnosis present

## 2015-12-25 DIAGNOSIS — Z8249 Family history of ischemic heart disease and other diseases of the circulatory system: Secondary | ICD-10-CM | POA: Diagnosis not present

## 2015-12-25 DIAGNOSIS — M169 Osteoarthritis of hip, unspecified: Secondary | ICD-10-CM | POA: Diagnosis not present

## 2015-12-25 DIAGNOSIS — M1611 Unilateral primary osteoarthritis, right hip: Principal | ICD-10-CM | POA: Diagnosis present

## 2015-12-25 DIAGNOSIS — Z820 Family history of epilepsy and other diseases of the nervous system: Secondary | ICD-10-CM | POA: Diagnosis not present

## 2015-12-25 DIAGNOSIS — I10 Essential (primary) hypertension: Secondary | ICD-10-CM | POA: Diagnosis present

## 2015-12-25 DIAGNOSIS — Z87891 Personal history of nicotine dependence: Secondary | ICD-10-CM

## 2015-12-25 DIAGNOSIS — I959 Hypotension, unspecified: Secondary | ICD-10-CM | POA: Diagnosis not present

## 2015-12-25 DIAGNOSIS — Z471 Aftercare following joint replacement surgery: Secondary | ICD-10-CM | POA: Diagnosis not present

## 2015-12-25 DIAGNOSIS — Z09 Encounter for follow-up examination after completed treatment for conditions other than malignant neoplasm: Secondary | ICD-10-CM

## 2015-12-25 HISTORY — PX: TOTAL HIP ARTHROPLASTY: SHX124

## 2015-12-25 SURGERY — ARTHROPLASTY, HIP, TOTAL, ANTERIOR APPROACH
Anesthesia: General | Site: Hip | Laterality: Right

## 2015-12-25 MED ORDER — HYDROMORPHONE HCL 1 MG/ML IJ SOLN
0.2500 mg | INTRAMUSCULAR | Status: DC | PRN
  Administered 2015-12-25 (×4): 0.5 mg via INTRAVENOUS

## 2015-12-25 MED ORDER — OXYCODONE HCL 5 MG PO TABS
ORAL_TABLET | ORAL | Status: AC
  Filled 2015-12-25: qty 1

## 2015-12-25 MED ORDER — ROCURONIUM BROMIDE 100 MG/10ML IV SOLN
INTRAVENOUS | Status: DC | PRN
  Administered 2015-12-25: 10 mg via INTRAVENOUS
  Administered 2015-12-25: 40 mg via INTRAVENOUS
  Administered 2015-12-25: 10 mg via INTRAVENOUS

## 2015-12-25 MED ORDER — TRANEXAMIC ACID 1000 MG/10ML IV SOLN
2000.0000 mg | INTRAVENOUS | Status: AC
  Administered 2015-12-25: 2000 mg via TOPICAL
  Filled 2015-12-25: qty 20

## 2015-12-25 MED ORDER — SUGAMMADEX SODIUM 200 MG/2ML IV SOLN
INTRAVENOUS | Status: DC | PRN
  Administered 2015-12-25: 150 mg via INTRAVENOUS

## 2015-12-25 MED ORDER — METHOCARBAMOL 1000 MG/10ML IJ SOLN
500.0000 mg | Freq: Four times a day (QID) | INTRAMUSCULAR | Status: DC | PRN
  Administered 2015-12-25: 500 mg via INTRAVENOUS
  Filled 2015-12-25 (×2): qty 5

## 2015-12-25 MED ORDER — MAGNESIUM CITRATE PO SOLN: 1.0000 | Freq: Once | ORAL | Status: DC | PRN

## 2015-12-25 MED ORDER — PROPOFOL 10 MG/ML IV BOLUS
INTRAVENOUS | Status: AC
  Filled 2015-12-25: qty 20

## 2015-12-25 MED ORDER — MONTELUKAST SODIUM 10 MG PO TABS
10.0000 mg | ORAL_TABLET | Freq: Every day | ORAL | Status: DC
Start: 2015-12-25 — End: 2015-12-26
  Administered 2015-12-25: 10 mg via ORAL
  Filled 2015-12-25: qty 1

## 2015-12-25 MED ORDER — ONDANSETRON HCL 4 MG/2ML IJ SOLN: 4.0000 mg | Freq: Four times a day (QID) | INTRAMUSCULAR | Status: DC | PRN

## 2015-12-25 MED ORDER — CLONIDINE HCL 0.1 MG PO TABS
0.1000 mg | ORAL_TABLET | Freq: Two times a day (BID) | ORAL | Status: DC
  Administered 2015-12-25 – 2015-12-26 (×2): 0.1 mg via ORAL
  Filled 2015-12-25 (×2): qty 1

## 2015-12-25 MED ORDER — ACETAMINOPHEN 10 MG/ML IV SOLN
1000.0000 mg | INTRAVENOUS | Status: DC
  Filled 2015-12-25: qty 100

## 2015-12-25 MED ORDER — SODIUM CHLORIDE 0.9 % IV SOLN: INTRAVENOUS | Status: DC

## 2015-12-25 MED ORDER — METHOCARBAMOL 500 MG PO TABS
500.0000 mg | ORAL_TABLET | Freq: Four times a day (QID) | ORAL | Status: DC | PRN
  Administered 2015-12-25: 500 mg via ORAL
  Filled 2015-12-25 (×2): qty 1

## 2015-12-25 MED ORDER — MENTHOL 3 MG MT LOZG: 1.0000 | LOZENGE | OROMUCOSAL | Status: DC | PRN

## 2015-12-25 MED ORDER — CEFAZOLIN SODIUM-DEXTROSE 2-4 GM/100ML-% IV SOLN
INTRAVENOUS | Status: AC
  Administered 2015-12-25: 2 g via INTRAVENOUS
  Filled 2015-12-25: qty 100

## 2015-12-25 MED ORDER — BUPIVACAINE-EPINEPHRINE (PF) 0.5% -1:200000 IJ SOLN
INTRAMUSCULAR | Status: DC | PRN
  Administered 2015-12-25: 30 mL

## 2015-12-25 MED ORDER — LOSARTAN POTASSIUM 50 MG PO TABS
50.0000 mg | ORAL_TABLET | Freq: Every day | ORAL | Status: DC
  Administered 2015-12-26: 50 mg via ORAL
  Filled 2015-12-25: qty 1

## 2015-12-25 MED ORDER — ALUM & MAG HYDROXIDE-SIMETH 200-200-20 MG/5ML PO SUSP: 30.0000 mL | ORAL | Status: DC | PRN

## 2015-12-25 MED ORDER — ONDANSETRON HCL 4 MG/2ML IJ SOLN
INTRAMUSCULAR | Status: AC
  Filled 2015-12-25: qty 2

## 2015-12-25 MED ORDER — PHENOL 1.4 % MT LIQD: 1.0000 | OROMUCOSAL | Status: DC | PRN

## 2015-12-25 MED ORDER — HYDROMORPHONE HCL 1 MG/ML IJ SOLN
INTRAMUSCULAR | Status: AC
  Filled 2015-12-25: qty 1

## 2015-12-25 MED ORDER — MIDAZOLAM HCL 5 MG/5ML IJ SOLN
INTRAMUSCULAR | Status: DC | PRN
  Administered 2015-12-25: 2 mg via INTRAVENOUS

## 2015-12-25 MED ORDER — ONDANSETRON HCL 4 MG PO TABS: 4.0000 mg | ORAL_TABLET | Freq: Four times a day (QID) | ORAL | Status: DC | PRN

## 2015-12-25 MED ORDER — ONDANSETRON HCL 4 MG/2ML IJ SOLN
INTRAMUSCULAR | Status: DC | PRN
  Administered 2015-12-25: 4 mg via INTRAVENOUS

## 2015-12-25 MED ORDER — MOMETASONE FURO-FORMOTEROL FUM 200-5 MCG/ACT IN AERO
2.0000 | INHALATION_SPRAY | Freq: Two times a day (BID) | RESPIRATORY_TRACT | Status: DC
  Administered 2015-12-25 – 2015-12-26 (×2): 2 via RESPIRATORY_TRACT
  Filled 2015-12-25: qty 8.8

## 2015-12-25 MED ORDER — DOCUSATE SODIUM 100 MG PO CAPS
100.0000 mg | ORAL_CAPSULE | Freq: Two times a day (BID) | ORAL | Status: DC
  Administered 2015-12-25 – 2015-12-26 (×2): 100 mg via ORAL
  Filled 2015-12-25 (×2): qty 1

## 2015-12-25 MED ORDER — MIDAZOLAM HCL 2 MG/2ML IJ SOLN
INTRAMUSCULAR | Status: AC
  Filled 2015-12-25: qty 2

## 2015-12-25 MED ORDER — SODIUM CHLORIDE 0.9 % IV SOLN
INTRAVENOUS | Status: DC
  Administered 2015-12-25: 21:00:00 via INTRAVENOUS

## 2015-12-25 MED ORDER — OXYCODONE HCL 5 MG PO TABS
5.0000 mg | ORAL_TABLET | Freq: Once | ORAL | Status: AC | PRN
Start: 2015-12-25 — End: 2015-12-25
  Administered 2015-12-25: 5 mg via ORAL

## 2015-12-25 MED ORDER — KETOROLAC TROMETHAMINE 15 MG/ML IJ SOLN
15.0000 mg | Freq: Four times a day (QID) | INTRAMUSCULAR | Status: DC
  Administered 2015-12-25 – 2015-12-26 (×3): 15 mg via INTRAVENOUS
  Filled 2015-12-25 (×3): qty 1

## 2015-12-25 MED ORDER — 0.9 % SODIUM CHLORIDE (POUR BTL) OPTIME
TOPICAL | Status: DC | PRN
  Administered 2015-12-25: 1000 mL

## 2015-12-25 MED ORDER — POLYETHYLENE GLYCOL 3350 17 G PO PACK: 17.0000 g | PACK | Freq: Every day | ORAL | Status: DC | PRN

## 2015-12-25 MED ORDER — ONDANSETRON HCL 4 MG/2ML IJ SOLN
4.0000 mg | Freq: Four times a day (QID) | INTRAMUSCULAR | Status: AC | PRN
  Administered 2015-12-25: 4 mg via INTRAVENOUS

## 2015-12-25 MED ORDER — PROPOFOL 10 MG/ML IV BOLUS
INTRAVENOUS | Status: DC | PRN
  Administered 2015-12-25: 120 mg via INTRAVENOUS

## 2015-12-25 MED ORDER — KETOROLAC TROMETHAMINE 30 MG/ML IJ SOLN
INTRAMUSCULAR | Status: DC | PRN
  Administered 2015-12-25: 30 mg via INTRAMUSCULAR

## 2015-12-25 MED ORDER — DIPHENHYDRAMINE HCL 50 MG/ML IJ SOLN
25.0000 mg | Freq: Four times a day (QID) | INTRAMUSCULAR | Status: DC | PRN
  Administered 2015-12-25 – 2015-12-26 (×3): 25 mg via INTRAVENOUS
  Filled 2015-12-25 (×3): qty 1

## 2015-12-25 MED ORDER — OXYCODONE HCL 5 MG/5ML PO SOLN: 5.0000 mg | Freq: Once | ORAL | Status: AC | PRN

## 2015-12-25 MED ORDER — ACETAMINOPHEN 325 MG PO TABS: 650.0000 mg | ORAL_TABLET | Freq: Four times a day (QID) | ORAL | Status: DC | PRN

## 2015-12-25 MED ORDER — SODIUM CHLORIDE 0.9 % IR SOLN
Status: DC | PRN
Start: 2015-12-25 — End: 2015-12-25
  Administered 2015-12-25: 3000 mL

## 2015-12-25 MED ORDER — METOCLOPRAMIDE HCL 5 MG/ML IJ SOLN: 5.0000 mg | Freq: Three times a day (TID) | INTRAMUSCULAR | Status: DC | PRN

## 2015-12-25 MED ORDER — ASPIRIN EC 325 MG PO TBEC
325.0000 mg | DELAYED_RELEASE_TABLET | Freq: Two times a day (BID) | ORAL | Status: DC
  Administered 2015-12-26: 325 mg via ORAL
  Filled 2015-12-25: qty 1

## 2015-12-25 MED ORDER — SENNA 8.6 MG PO TABS
2.0000 | ORAL_TABLET | Freq: Every day | ORAL | Status: DC
  Administered 2015-12-25: 17.2 mg via ORAL
  Filled 2015-12-25: qty 2

## 2015-12-25 MED ORDER — TRANEXAMIC ACID 1000 MG/10ML IV SOLN
1000.0000 mg | Freq: Once | INTRAVENOUS | Status: DC
  Filled 2015-12-25: qty 10

## 2015-12-25 MED ORDER — LIDOCAINE HCL (CARDIAC) 20 MG/ML IV SOLN
INTRAVENOUS | Status: DC | PRN
  Administered 2015-12-25: 60 mg via INTRAVENOUS

## 2015-12-25 MED ORDER — ATORVASTATIN CALCIUM 10 MG PO TABS
10.0000 mg | ORAL_TABLET | Freq: Every day | ORAL | Status: DC
  Administered 2015-12-25: 10 mg via ORAL
  Filled 2015-12-25: qty 1

## 2015-12-25 MED ORDER — PANTOPRAZOLE SODIUM 40 MG PO TBEC: 80.0000 mg | DELAYED_RELEASE_TABLET | Freq: Every day | ORAL | Status: DC

## 2015-12-25 MED ORDER — FENTANYL CITRATE (PF) 250 MCG/5ML IJ SOLN
INTRAMUSCULAR | Status: AC
  Filled 2015-12-25: qty 5

## 2015-12-25 MED ORDER — METOCLOPRAMIDE HCL 5 MG PO TABS: 5.0000 mg | ORAL_TABLET | Freq: Three times a day (TID) | ORAL | Status: DC | PRN

## 2015-12-25 MED ORDER — CEFAZOLIN SODIUM 1-5 GM-% IV SOLN
1.0000 g | Freq: Four times a day (QID) | INTRAVENOUS | Status: AC
  Administered 2015-12-25 – 2015-12-26 (×2): 1 g via INTRAVENOUS
  Filled 2015-12-25 (×3): qty 50

## 2015-12-25 MED ORDER — ACETAMINOPHEN 650 MG RE SUPP: 650.0000 mg | Freq: Four times a day (QID) | RECTAL | Status: DC | PRN

## 2015-12-25 MED ORDER — HYDROCODONE-ACETAMINOPHEN 5-325 MG PO TABS
1.0000 | ORAL_TABLET | ORAL | Status: DC | PRN
  Administered 2015-12-25 – 2015-12-26 (×5): 2 via ORAL
  Filled 2015-12-25 (×5): qty 2

## 2015-12-25 MED ORDER — BUPIVACAINE-EPINEPHRINE (PF) 0.5% -1:200000 IJ SOLN
INTRAMUSCULAR | Status: AC
  Filled 2015-12-25: qty 30

## 2015-12-25 MED ORDER — LACTATED RINGERS IV SOLN
INTRAVENOUS | Status: DC
  Administered 2015-12-25: 50 mL/h via INTRAVENOUS
  Administered 2015-12-25 (×2): via INTRAVENOUS

## 2015-12-25 MED ORDER — POVIDONE-IODINE 10 % EX SWAB: 2.0000 "application " | Freq: Once | CUTANEOUS | Status: DC

## 2015-12-25 MED ORDER — SODIUM CHLORIDE 0.9 % IJ SOLN
INTRAMUSCULAR | Status: DC | PRN
  Administered 2015-12-25: 30 mL

## 2015-12-25 MED ORDER — HYDROMORPHONE HCL 1 MG/ML IJ SOLN: 0.5000 mg | INTRAMUSCULAR | Status: DC | PRN

## 2015-12-25 MED ORDER — DEXAMETHASONE SODIUM PHOSPHATE 10 MG/ML IJ SOLN
10.0000 mg | Freq: Once | INTRAMUSCULAR | Status: AC
  Administered 2015-12-26: 10 mg via INTRAVENOUS
  Filled 2015-12-25: qty 1

## 2015-12-25 MED ORDER — SODIUM CHLORIDE 0.9 % IJ SOLN
INTRAMUSCULAR | Status: AC
  Filled 2015-12-25: qty 30

## 2015-12-25 MED ORDER — ALBUTEROL SULFATE (2.5 MG/3ML) 0.083% IN NEBU: 3.0000 mL | INHALATION_SOLUTION | RESPIRATORY_TRACT | Status: DC | PRN

## 2015-12-25 MED ORDER — FENTANYL CITRATE (PF) 250 MCG/5ML IJ SOLN
INTRAMUSCULAR | Status: DC | PRN
  Administered 2015-12-25: 75 ug via INTRAVENOUS
  Administered 2015-12-25 (×2): 50 ug via INTRAVENOUS
  Administered 2015-12-25: 75 ug via INTRAVENOUS
  Administered 2015-12-25: 100 ug via INTRAVENOUS

## 2015-12-25 SURGICAL SUPPLY — 69 items
ADH SKN CLS APL DERMABOND .7 (GAUZE/BANDAGES/DRESSINGS) ×2
ADH SKN CLS LQ APL DERMABOND (GAUZE/BANDAGES/DRESSINGS) ×1
ALCOHOL ISOPROPYL (RUBBING) (MISCELLANEOUS) ×3 IMPLANT
BALL HIP CERAMIC (Hips) IMPLANT
BLADE SURG ROTATE 9660 (MISCELLANEOUS) ×2 IMPLANT
CAPT HIP TOTAL 2 ×2 IMPLANT
CHLORAPREP W/TINT 26ML (MISCELLANEOUS) ×3 IMPLANT
COVER SURGICAL LIGHT HANDLE (MISCELLANEOUS) ×3 IMPLANT
DERMABOND ADHESIVE PROPEN (GAUZE/BANDAGES/DRESSINGS) ×2
DERMABOND ADVANCED (GAUZE/BANDAGES/DRESSINGS) ×4
DERMABOND ADVANCED .7 DNX12 (GAUZE/BANDAGES/DRESSINGS) ×2 IMPLANT
DERMABOND ADVANCED .7 DNX6 (GAUZE/BANDAGES/DRESSINGS) ×1 IMPLANT
DRAPE C-ARM 42X72 X-RAY (DRAPES) ×3 IMPLANT
DRAPE IMP U-DRAPE 54X76 (DRAPES) ×4 IMPLANT
DRAPE PROXIMA HALF (DRAPES) ×2 IMPLANT
DRAPE STERI IOBAN 125X83 (DRAPES) ×3 IMPLANT
DRAPE U-SHAPE 47X51 STRL (DRAPES) ×9 IMPLANT
DRSG AQUACEL AG ADV 3.5X10 (GAUZE/BANDAGES/DRESSINGS) ×3 IMPLANT
ELECT BLADE 4.0 EZ CLEAN MEGAD (MISCELLANEOUS) ×3
ELECT REM PT RETURN 9FT ADLT (ELECTROSURGICAL) ×3
ELECTRODE BLDE 4.0 EZ CLN MEGD (MISCELLANEOUS) ×1 IMPLANT
ELECTRODE REM PT RTRN 9FT ADLT (ELECTROSURGICAL) ×1 IMPLANT
EVACUATOR 1/8 PVC DRAIN (DRAIN) IMPLANT
GLOVE BIO SURGEON STRL SZ7.5 (GLOVE) ×2 IMPLANT
GLOVE BIO SURGEON STRL SZ8.5 (GLOVE) ×6 IMPLANT
GLOVE BIOGEL PI IND STRL 7.0 (GLOVE) IMPLANT
GLOVE BIOGEL PI IND STRL 7.5 (GLOVE) IMPLANT
GLOVE BIOGEL PI IND STRL 8.5 (GLOVE) ×1 IMPLANT
GLOVE BIOGEL PI INDICATOR 7.0 (GLOVE) ×2
GLOVE BIOGEL PI INDICATOR 7.5 (GLOVE) ×4
GLOVE BIOGEL PI INDICATOR 8.5 (GLOVE) ×2
GLOVE SURG SS PI 7.0 STRL IVOR (GLOVE) ×2 IMPLANT
GOWN STRL REUS W/ TWL LRG LVL3 (GOWN DISPOSABLE) ×2 IMPLANT
GOWN STRL REUS W/TWL 2XL LVL3 (GOWN DISPOSABLE) ×3 IMPLANT
GOWN STRL REUS W/TWL LRG LVL3 (GOWN DISPOSABLE) ×6
HANDPIECE INTERPULSE COAX TIP (DISPOSABLE) ×3
HIP BALL CERAMIC (Hips) ×3 IMPLANT
HOOD PEEL AWAY FACE SHEILD DIS (HOOD) ×6 IMPLANT
KIT BASIN OR (CUSTOM PROCEDURE TRAY) ×3 IMPLANT
KIT ROOM TURNOVER OR (KITS) ×3 IMPLANT
MANIFOLD NEPTUNE II (INSTRUMENTS) ×3 IMPLANT
MARKER SKIN DUAL TIP RULER LAB (MISCELLANEOUS) ×6 IMPLANT
NEEDLE SPNL 18GX3.5 QUINCKE PK (NEEDLE) ×3 IMPLANT
NS IRRIG 1000ML POUR BTL (IV SOLUTION) ×3 IMPLANT
PACK TOTAL JOINT (CUSTOM PROCEDURE TRAY) ×3 IMPLANT
PACK UNIVERSAL I (CUSTOM PROCEDURE TRAY) ×3 IMPLANT
PAD ARMBOARD 7.5X6 YLW CONV (MISCELLANEOUS) ×6 IMPLANT
SAW OSC TIP CART 19.5X105X1.3 (SAW) ×3 IMPLANT
SCRUB POVIDONE IODINE 4 OZ (MISCELLANEOUS) ×2 IMPLANT
SEALER BIPOLAR AQUA 6.0 (INSTRUMENTS) ×3 IMPLANT
SET HNDPC FAN SPRY TIP SCT (DISPOSABLE) ×1 IMPLANT
SOLUTION BETADINE 4OZ (MISCELLANEOUS) ×3 IMPLANT
SUCTION FRAZIER HANDLE 10FR (MISCELLANEOUS) ×2
SUCTION TUBE FRAZIER 10FR DISP (MISCELLANEOUS) ×1 IMPLANT
SUT ETHIBOND NAB CT1 #1 30IN (SUTURE) ×6 IMPLANT
SUT MNCRL AB 3-0 PS2 18 (SUTURE) ×3 IMPLANT
SUT MON AB 2-0 CT1 36 (SUTURE) ×5 IMPLANT
SUT VIC AB 0 CT1 27 (SUTURE) ×3
SUT VIC AB 0 CT1 27XBRD ANBCTR (SUTURE) ×1 IMPLANT
SUT VIC AB 1 CT1 27 (SUTURE) ×3
SUT VIC AB 1 CT1 27XBRD ANBCTR (SUTURE) ×1 IMPLANT
SUT VIC AB 2-0 CT1 27 (SUTURE) ×3
SUT VIC AB 2-0 CT1 TAPERPNT 27 (SUTURE) ×1 IMPLANT
SUT VLOC 180 0 24IN GS25 (SUTURE) ×5 IMPLANT
SYR 50ML LL SCALE MARK (SYRINGE) ×3 IMPLANT
TOWEL OR 17X24 6PK STRL BLUE (TOWEL DISPOSABLE) ×3 IMPLANT
TOWEL OR 17X26 10 PK STRL BLUE (TOWEL DISPOSABLE) ×3 IMPLANT
TRAY FOLEY CATH 16FR SILVER (SET/KITS/TRAYS/PACK) IMPLANT
WATER STERILE IRR 1000ML POUR (IV SOLUTION) ×9 IMPLANT

## 2015-12-25 NOTE — H&P (View-Only) (Signed)
TOTAL HIP ADMISSION H&P  Patient is admitted for right total hip arthroplasty.  Subjective:  Chief Complaint: right hip pain  HPI: Jean Hudson, 65 y.o. female, has a history of pain and functional disability in the right hip(s) due to arthritis and patient has failed non-surgical conservative treatments for greater than 12 weeks to include NSAID's and/or analgesics, flexibility and strengthening excercises, use of assistive devices and activity modification.  Onset of symptoms was gradual starting 2 years ago with gradually worsening course since that time.The patient noted no past surgery on the right hip(s).  Patient currently rates pain in the right hip at 10 out of 10 with activity. Patient has night pain, worsening of pain with activity and weight bearing, pain that interfers with activities of daily living and pain with passive range of motion. Patient has evidence of subchondral cysts, subchondral sclerosis, periarticular osteophytes and joint space narrowing by imaging studies. This condition presents safety issues increasing the risk of falls. There is no current active infection.  Patient Active Problem List   Diagnosis Date Noted  . Osteoarthritis of left hip 09/21/2015  . Stress fracture of hip with nonunion 09/21/2015  . HLD (hyperlipidemia) 08/10/2015  . Former smoker, stopped smoking in distant past 08/10/2015  . Family history of heart disease 04/19/2014  . Pain in shoulder 04/19/2014   Past Medical History  Diagnosis Date  . Asthma   . Seasonal allergies   . Rotator cuff tear, right 08/2011  . SLAP lesion of shoulder 08/2011    right  . Hyperlipidemia   . Family history of adverse reaction to anesthesia     pts father agitated following anesthesia;   . History of bronchitis   . Shortness of breath dyspnea     pt states gets SOB if does not use Advair regularly  . Wears glasses   . GERD (gastroesophageal reflux disease)   . Arthritis   . Rotator cuff tear    left 05/2014  . Colon polyp   . Scoliosis   . Spondylosis of lumbar joint   . Hypertension     Past Surgical History  Procedure Laterality Date  . Dorsal compartment release  12/12/2005    right 1st dorsal compartment  . Trigger finger release  07/23/2011    Procedure: RELEASE TRIGGER FINGER/A-1 PULLEY;  Surgeon: Cammie Sickle., MD;  Location: [place];  Service: Orthopedics;  Laterality: Left;  left thumb  . Dorsal compartment release  07/23/2011    Procedure: RELEASE DORSAL COMPARTMENT (DEQUERVAIN);  Surgeon: Cammie Sickle., MD;  Location: [facility];  Service: Orthopedics;  Laterality: Left;  1st left dorsal compartment  . Back surgery  1988  . Shoulder arthroscopy  2/13    rt rcr  . Tubal ligation    . Colonoscopy    . Shoulder arthroscopy with rotator cuff repair and subacromial decompression  06/02/2012    Procedure: SHOULDER ARTHROSCOPY WITH ROTATOR CUFF REPAIR AND SUBACROMIAL DECOMPRESSION;  Surgeon: Cammie Sickle., MD;  Location: [place];  Service: Orthopedics;  Laterality: Left;  LEFT SHOULDER ARTHROSCOPY WITH SUBACROMIAL DECOMPRESSION, DISTAL CLAVICLE RESECTION AND REPAIR ROTATOR CUFF  . Trigger finger release Left 04/06/2015    Procedure: RELEASE A-1 PULLEY LEFT LONG AND LEFT RING FINGERS;  Surgeon: Leanora Cover, MD;  Location: [place];  Service: Orthopedics;  Laterality: Left;  . Cortisone injection    . Total hip arthroplasty Left 09/21/2015    Procedure:  LEFT TOTAL HIP ARTHROPLASTY ANTERIOR APPROACH;  Surgeon: Rod Can, MD;  Location: [facility];  Service: Orthopedics;  Laterality: Left;     (Not in a hospital admission) Allergies  Allergen Reactions  . Tramadol Other (See Comments)    "makes me dizzy"  . Demerol Nausea And Vomiting  . Flagyl [Metronidazole Hcl] Swelling and Rash    JOINT SWELLING  . Hydrocodone Itching    Social History  Substance Use Topics  . Smoking status: Former  Smoker -- 1.50 packs/day for 25 years    Types: Cigarettes    Quit date: 02/13/1992  . Smokeless tobacco: Never Used  . Alcohol Use: No    Family History  Problem Relation Age of Onset  . Anesthesia problems Father     very agitated waking up post-op  . Alzheimer's disease Father   . Hyperlipidemia Father   . Heart attack Father   . Heart attack Mother   . Heart disease Mother   . Hypertension Mother   . Melanoma Mother   . Stroke Mother      Review of Systems  Constitutional: Negative.   Respiratory: Negative.   Cardiovascular: Negative.   Gastrointestinal: Negative.   Genitourinary: Negative.   Musculoskeletal: Positive for back pain and joint pain.  Skin: Negative.   Neurological: Negative.   Endo/Heme/Allergies: Negative.   Psychiatric/Behavioral: Negative.     Objective:  Physical Exam  Vitals reviewed. Constitutional: She is oriented to person, place, and time. She appears well-developed and well-nourished.  HENT:  Head: Normocephalic and atraumatic.  Eyes: Conjunctivae and EOM are normal. Pupils are equal, round, and reactive to light.  Neck: Normal range of motion. Neck supple.  Cardiovascular: Normal rate and regular rhythm.   Respiratory: Effort normal and breath sounds normal. No respiratory distress.  GI: Soft. Bowel sounds are normal. She exhibits no distension.  Genitourinary:  deferred  Musculoskeletal:       Right hip: She exhibits decreased range of motion and crepitus.  Neurological: She is alert and oriented to person, place, and time. She has normal reflexes.  Skin: Skin is warm.  Psychiatric: She has a normal mood and affect. Her behavior is normal. Judgment and thought content normal.    Vital signs in last 24 hours: @VSRANGES @  Labs:   Estimated body mass index is 24.55 kg/(m^2) as calculated from the following:   Height as of 09/21/15: 5\' 5"  (1.651 m).   Weight as of 09/13/15: 66.906 kg (147 lb 8 oz).   Imaging Review Plain  radiographs demonstrate severe degenerative joint disease of the right hip(s). The bone quality appears to be adequate for age and reported activity level.  Assessment/Plan:  End stage arthritis, right hip(s)  The patient history, physical examination, clinical judgement of the provider and imaging studies are consistent with end stage degenerative joint disease of the right hip(s) and total hip arthroplasty is deemed medically necessary. The treatment options including medical management, injection therapy, arthroscopy and arthroplasty were discussed at length. The risks and benefits of total hip arthroplasty were presented and reviewed. The risks due to aseptic loosening, infection, stiffness, dislocation/subluxation,  thromboembolic complications and other imponderables were discussed.  The patient acknowledged the explanation, agreed to proceed with the plan and consent was signed. Patient is being admitted for inpatient treatment for surgery, pain control, PT, OT, prophylactic antibiotics, VTE prophylaxis, progressive ambulation and ADL's and discharge planning.The patient is planning to be discharged home with home health services

## 2015-12-25 NOTE — Anesthesia Procedure Notes (Signed)
Procedure Name: Intubation Date/Time: 12/25/2015 12:55 PM Performed by: Oletta Lamas Pre-anesthesia Checklist: Patient identified, Emergency Drugs available, Suction available and Patient being monitored Patient Re-evaluated:Patient Re-evaluated prior to inductionOxygen Delivery Method: Circle System Utilized Preoxygenation: Pre-oxygenation with 100% oxygen Intubation Type: IV induction Ventilation: Mask ventilation without difficulty Laryngoscope Size: Mac and 3 Tube type: Oral Nasal Tubes: Nasal Rae and Nasal prep performed Tube size: 7.0 mm Number of attempts: 1 Airway Equipment and Method: Stylet Placement Confirmation: ETT inserted through vocal cords under direct vision,  positive ETCO2 and breath sounds checked- equal and bilateral Secured at: 22 cm Tube secured with: Tape Dental Injury: Teeth and Oropharynx as per pre-operative assessment and Injury to lip  Difficulty Due To: Difficulty was unanticipated Comments: Approximately 39mm X 59mm lip abrasion with laryngoscopy. Upper lip midline.  Lacrilube applied to lips.

## 2015-12-25 NOTE — Interval H&P Note (Signed)
History and Physical Interval Note:  12/25/2015 12:40 PM  Jean Hudson  has presented today for surgery, with the diagnosis of right hip osteoarthritis  The various methods of treatment have been discussed with the patient and family. After consideration of risks, benefits and other options for treatment, the patient has consented to  Procedure(s) with comments: RIGHT TOTAL HIP ARTHROPLASTY ANTERIOR APPROACH (Right) - Needs RNFA as a surgical intervention .  The patient's history has been reviewed, patient examined, no change in status, stable for surgery.  I have reviewed the patient's chart and labs.  Questions were answered to the patient's satisfaction.     Mekhi Sonn, Horald Pollen

## 2015-12-25 NOTE — Anesthesia Postprocedure Evaluation (Signed)
Anesthesia Post Note  Patient: Jean Hudson  Procedure(s) Performed: Procedure(s) (LRB): RIGHT TOTAL HIP ARTHROPLASTY ANTERIOR APPROACH (Right)  Patient location during evaluation: PACU Anesthesia Type: General Level of consciousness: awake and alert and patient cooperative Pain management: pain level controlled Vital Signs Assessment: post-procedure vital signs reviewed and stable Respiratory status: spontaneous breathing and respiratory function stable Cardiovascular status: stable Anesthetic complications: no    Last Vitals:  Filed Vitals:   12/25/15 1545 12/25/15 1600  BP: 140/61 138/64  Pulse: 67 69  Temp:    Resp: 17 15    Last Pain:  Filed Vitals:   12/25/15 1607  PainSc: [place]

## 2015-12-25 NOTE — Op Note (Signed)
OPERATIVE REPORT  SURGEON: Rod Can, MD   ASSISTANT: Ky Barban, RNFA.  PREOPERATIVE DIAGNOSIS: Right hip arthritis.   POSTOPERATIVE DIAGNOSIS: Right hip arthritis.   PROCEDURE: Right total hip arthroplasty, anterior approach.   IMPLANTS: DePuy Tri Lock stem, size 5, standard offset. DePuy Pinnacle Cup, size 52 mm. DePuy Altrx liner, size 32 by 52 mm, neutral. DePuy Biolox ceramic head ball, size 32 + 9 mm.  ANESTHESIA:  General  ESTIMATED BLOOD LOSS: 350 mL.  ANTIBIOTICS: 2 g Ancef.  DRAINS: None.  COMPLICATIONS: None.   CONDITION: PACU - hemodynamically stable.Marland Kitchen   BRIEF CLINICAL NOTE: Jean Hudson is a 65 y.o. female with a long-standing history of Right hip arthritis. After failing conservative management, the patient was indicated for total hip arthroplasty. The risks, benefits, and alternatives to the procedure were explained, and the patient elected to proceed.  PROCEDURE IN DETAIL: Surgical site was marked by myself. Spinal anesthesia was obtained in the pre-op holding area. Once inside the operative room, a foley catheter was inserted. The patient was then positioned on the Hana table. All bony prominences were well padded. The hip was prepped and draped in the normal sterile surgical fashion. A time-out was called verifying side and site of surgery. The patient received IV antibiotics within 60 minutes of beginning the procedure.  The direct anterior approach to the hip was performed through the Hueter interval. Lateral femoral circumflex vessels were treated with the Auqumantys. The anterior capsule was exposed and an inverted T capsulotomy was made.The femoral neck cut was made to the level of the templated cut. A corkscrew was placed into the head and the head was removed. The femoral head was found to have eburnated bone. The head was passed to the back table and was measured.  Acetabular exposure was achieved, and the pulvinar and labrum  were excised. Sequental reaming of the acetabulum was then performed up to a size 51 mm reamer. A 52 mm cup was then opened and impacted into place at approximately 40 degrees of abduction and 20 degrees of anteversion. The final polyethylene liner was impacted into place and acetabular osteophytes were removed.   I then gained femoral exposure taking care to protect the abductors and greater trochanter. This was performed using standard external rotation, extension, and adduction. The capsule was peeled off the inner aspect of the greater trochanter, taking care to preserve the short external rotators. A cookie cutter was used to enter the femoral canal, and then the femoral canal finder was placed. Sequential broaching was performed up to a size 5. Calcar planer was used on the femoral neck remnant. I placed a std offset neck and a trial head ball. The hip was reduced. Leg lengths and offset were checked fluoroscopically. The hip was dislocated and trial components were removed. The final implants were placed, and the hip was reduced.  Fluoroscopy was used to confirm component position and leg lengths. At 90 degrees of external rotation and full extension, the hip was stable to an anterior directed force.  The wound was copiously irrigated with a dilute betadine solution followed by normal saline. Marcaine solution was injected into the periarticular soft tissue. The wound was closed in layers using #1 Vicryl and V-Loc for the fascia, 2-0 Vicryl for the subcutaneous fat, 2-0 Monocryl for the deep dermal layer, 3-0 running Monocryl subcuticular stitch, and Dermabond for the skin. Once the glue was fully dried, an Aquacell Ag dressing was applied. The patient was transported to the recovery  room in stable condition. Sponge, needle, and instrument counts were correct at the end of the case x2. The patient tolerated the procedure well and there were no known complications.

## 2015-12-25 NOTE — Transfer of Care (Signed)
Immediate Anesthesia Transfer of Care Note  Patient: Jean Hudson  Procedure(s) Performed: Procedure(s) with comments: RIGHT TOTAL HIP ARTHROPLASTY ANTERIOR APPROACH (Right) - Needs RNFA  Patient Location: PACU  Anesthesia Type:General  Level of Consciousness: awake, oriented and patient cooperative  Airway & Oxygen Therapy: Patient Spontanous Breathing and Patient connected to nasal cannula oxygen  Post-op Assessment: Report given to RN, Post -op Vital signs reviewed and stable and Patient moving all extremities  Post vital signs: Reviewed and stable  Last Vitals:  Filed Vitals:   12/25/15 1051 12/25/15 1518  BP: 139/45 135/58  Pulse: 69 68  Temp: 37.2 C 36.4 C  Resp: 20 10    Last Pain:  Filed Vitals:   12/25/15 1520  PainSc: 3          Complications: No apparent anesthesia complications

## 2015-12-25 NOTE — Discharge Instructions (Signed)
°Dr. Zack Crager °Joint Replacement Specialist °[facility] °[address] °[place], [place] [address] °[phone] ° ° °TOTAL HIP REPLACEMENT POSTOPERATIVE DIRECTIONS ° ° ° °Hip Rehabilitation, Guidelines Following Surgery  ° °WEIGHT BEARING °Weight bearing as tolerated with assist device (walker, cane, etc) as directed, use it as long as suggested by your surgeon or therapist, typically at least 4-6 weeks. ° °The results of a hip operation are greatly improved after range of motion and muscle strengthening exercises. Follow all safety measures which are given to protect your hip. If any of these exercises cause increased pain or swelling in your joint, decrease the amount until you are comfortable again. Then slowly increase the exercises. Call your caregiver if you have problems or questions.  ° °HOME CARE INSTRUCTIONS  °Most of the following instructions are designed to prevent the dislocation of your new hip.  °Remove items at home which could result in a fall. This includes throw rugs or furniture in walking pathways.  °Continue medications as instructed at time of discharge. °· You may have some home medications which will be placed on hold until you complete the course of blood thinner medication. °· You may start showering once you are discharged home. Do not remove your dressing. °Do not put on socks or shoes without following the instructions of your caregivers.   °Sit on chairs with arms. Use the chair arms to help push yourself up when arising.  °Arrange for the use of a toilet seat elevator so you are not sitting low.  °· Walk with walker as instructed.  °You may resume a sexual relationship in one month or when given the OK by your caregiver.  °Use walker as long as suggested by your caregivers.  °You may put full weight on your legs and walk as much as is comfortable. °Avoid periods of inactivity such as sitting longer than an hour when not asleep. This helps prevent  blood clots.  °You may return to work once you are cleared by your surgeon.  °Do not drive a car for 6 weeks or until released by your surgeon.  °Do not drive while taking narcotics.  °Wear elastic stockings for two weeks following surgery during the day but you may remove then at night.  °Make sure you keep all of your appointments after your operation with all of your doctors and caregivers. You should call the office at the above phone number and make an appointment for approximately two weeks after the date of your surgery. °Please pick up a stool softener and laxative for home use as long as you are requiring pain medications. °· ICE to the affected hip every three hours for 30 minutes at a time and then as needed for pain and swelling. Continue to use ice on the hip for pain and swelling from surgery. You may notice swelling that will progress down to the foot and ankle.  This is normal after surgery.  Elevate the leg when you are not up walking on it.   °It is important for you to complete the blood thinner medication as prescribed by your doctor. °· Continue to use the breathing machine which will help keep your temperature down.  It is common for your temperature to cycle up and down following surgery, especially at night when you are not up moving around and exerting yourself.  The breathing machine keeps your lungs expanded and your temperature down. ° °RANGE OF MOTION AND STRENGTHENING EXERCISES  °These exercises are   designed to help you keep full movement of your hip joint. Follow your caregiver's or physical therapist's instructions. Perform all exercises about fifteen times, three times per day or as directed. Exercise both hips, even if you have had only one joint replacement. These exercises can be done on a training (exercise) mat, on the floor, on a table or on a bed. Use whatever works the best and is most comfortable for you. Use music or television while you are exercising so that the exercises  are a pleasant break in your day. This will make your life better with the exercises acting as a break in routine you can look forward to.  °Lying on your back, slowly slide your foot toward your buttocks, raising your knee up off the floor. Then slowly slide your foot back down until your leg is straight again.  °Lying on your back spread your legs as far apart as you can without causing discomfort.  °Lying on your side, raise your upper leg and foot straight up from the floor as far as is comfortable. Slowly lower the leg and repeat.  °Lying on your back, tighten up the muscle in the front of your thigh (quadriceps muscles). You can do this by keeping your leg straight and trying to raise your heel off the floor. This helps strengthen the largest muscle supporting your knee.  °Lying on your back, tighten up the muscles of your buttocks both with the legs straight and with the knee bent at a comfortable angle while keeping your heel on the floor.  ° °SKILLED REHAB INSTRUCTIONS: °If the patient is transferred to a skilled rehab facility following release from the hospital, a list of the current medications will be sent to the facility for the patient to continue.  When discharged from the skilled rehab facility, please have the facility set up the patient's Home Health Physical Therapy prior to being released. Also, the skilled facility will be responsible for providing the patient with their medications at time of release from the facility to include their pain medication and their blood thinner medication. If the patient is still at the rehab facility at time of the two week follow up appointment, the skilled rehab facility will also need to assist the patient in arranging follow up appointment in our office and any transportation needs. ° °MAKE SURE YOU:  °Understand these instructions.  °Will watch your condition.  °Will get help right away if you are not doing well or get worse. ° °Pick up stool softner and  laxative for home use following surgery while on pain medications. °Do not remove your dressing. °The dressing is waterproof--it is OK to take showers. °Continue to use ice for pain and swelling after surgery. °Do not use any lotions or creams on the incision until instructed by your surgeon. °Total Hip Protocol. ° ° °

## 2015-12-25 NOTE — Anesthesia Preprocedure Evaluation (Signed)
Anesthesia Evaluation  Patient identified by MRN, date of birth, ID band Patient awake    Reviewed: Allergy & Precautions, NPO status , Patient's Chart, lab work & pertinent test results  Airway Mallampati: II  TM Distance: >3 FB Neck ROM: Full    Dental no notable dental hx. (+) Teeth Intact, Dental Advisory Given   Pulmonary asthma , former smoker,    Pulmonary exam normal breath sounds clear to auscultation       Cardiovascular hypertension, Pt. on medications Normal cardiovascular exam Rhythm:Regular Rate:Normal     Neuro/Psych negative neurological ROS  negative psych ROS   GI/Hepatic negative GI ROS, Neg liver ROS, GERD  ,  Endo/Other  negative endocrine ROS  Renal/GU negative Renal ROS  negative genitourinary   Musculoskeletal negative musculoskeletal ROS (+) Arthritis ,   Abdominal   Peds negative pediatric ROS (+)  Hematology negative hematology ROS (+)   Anesthesia Other Findings   Reproductive/Obstetrics negative OB ROS                             Anesthesia Physical Anesthesia Plan  ASA: II  Anesthesia Plan: General   Post-op Pain Management:    Induction: Intravenous  Airway Management Planned: Oral ETT  Additional Equipment:   Intra-op Plan:   Post-operative Plan: Extubation in OR  Informed Consent:   Dental advisory given  Plan Discussed with: CRNA and Surgeon  Anesthesia Plan Comments:         Anesthesia Quick Evaluation

## 2015-12-26 ENCOUNTER — Encounter (HOSPITAL_COMMUNITY): Payer: Self-pay | Admitting: Orthopedic Surgery

## 2015-12-26 LAB — BASIC METABOLIC PANEL
ANION GAP: 7 (ref 5–15)
BUN: 9 mg/dL (ref 6–20)
CALCIUM: 8.4 mg/dL — AB (ref 8.9–10.3)
CO2: 26 mmol/L (ref 22–32)
CREATININE: 0.75 mg/dL (ref 0.44–1.00)
Chloride: 102 mmol/L (ref 101–111)
Glucose, Bld: 90 mg/dL (ref 65–99)
Potassium: 3.6 mmol/L (ref 3.5–5.1)
Sodium: 135 mmol/L (ref 135–145)

## 2015-12-26 LAB — CBC
HEMATOCRIT: 32.2 % — AB (ref 36.0–46.0)
Hemoglobin: 10.6 g/dL — ABNORMAL LOW (ref 12.0–15.0)
MCH: 29 pg (ref 26.0–34.0)
MCHC: 32.9 g/dL (ref 30.0–36.0)
MCV: 88.2 fL (ref 78.0–100.0)
PLATELETS: 248 10*3/uL (ref 150–400)
RBC: 3.65 MIL/uL — ABNORMAL LOW (ref 3.87–5.11)
RDW: 12.7 % (ref 11.5–15.5)
WBC: 6.6 10*3/uL (ref 4.0–10.5)

## 2015-12-26 MED ORDER — DIPHENHYDRAMINE HCL 25 MG PO CAPS: 25.0000 mg | ORAL_CAPSULE | Freq: Four times a day (QID) | ORAL | Status: DC | PRN

## 2015-12-26 MED ORDER — HYDROCODONE-ACETAMINOPHEN 5-325 MG PO TABS: 1.0000 | ORAL_TABLET | ORAL | Status: DC | PRN

## 2015-12-26 MED ORDER — SODIUM CHLORIDE 0.9 % IV BOLUS (SEPSIS)
1000.0000 mL | Freq: Once | INTRAVENOUS | Status: AC
  Administered 2015-12-26: 1000 mL via INTRAVENOUS

## 2015-12-26 NOTE — Care Management Note (Signed)
Case Management Note  Patient Details  Name: HOUSTON BRIDEN MRN: [id number] Date of Birth: [birth year]  Subjective/Objective:   65 yr old female s/p right total hip arthroplasty .                Action/Plan:  Case manager spoke with patient concerning home health and DME needs. Patient states she discussed home health with Dr. And that he stated she will not need it this time. Patient states she can do exercises and she has borrowed a rolling walker. No further Case manager needs identified.   Expected Discharge Date:   12/27/15               Expected Discharge Plan:  Home/Self Care  In-House Referral:     Discharge planning Services  CM Consult  Post Acute Care Choice:  NA Choice offered to:  Patient  DME Arranged:  N/A DME Agency:     HH Arranged:  NA HH Agency:  NA  Status of Service:  Completed, signed off  Medicare Important Message Given:  Yes Date Medicare IM Given:    Medicare IM give by:    Date Additional Medicare IM Given:    Additional Medicare Important Message give by:     If discussed at [place] of Stay Meetings, dates discussed:    Additional Comments:  Ninfa Meeker, RN 12/26/2015, 2:25 PM

## 2015-12-26 NOTE — Addendum Note (Signed)
Addendum  created 12/26/15 1011 by Moshe Salisbury, CRNA   Modules edited: Anesthesia Medication Administration

## 2015-12-26 NOTE — Progress Notes (Signed)
Pt ready for discharge. Education/instructions reviewed with pt and husband and all questions/concerns addressed. IV removed and belongings gathered. Pt will be transported out via wheelchair to husband's car. Will continue to monitor 

## 2015-12-26 NOTE — Discharge Summary (Signed)
Physician Discharge Summary  Patient ID: Jean Hudson MRN: [id number] DOB/AGE: 1950-12-13 65 y.o.  Admit date: 12/25/2015 Discharge date: 12/26/2015  Admission Diagnoses:  DJD R hip  Discharge Diagnoses:  Active Problems:   Primary osteoarthritis of right hip   Past Medical History  Diagnosis Date  . Asthma   . Seasonal allergies   . Rotator cuff tear, right 08/2011  . SLAP lesion of shoulder 08/2011    right  . Hyperlipidemia   . Family history of adverse reaction to anesthesia     pts father agitated following anesthesia;   . History of bronchitis   . Shortness of breath dyspnea     pt states gets SOB if does not use Advair regularly  . Wears glasses   . GERD (gastroesophageal reflux disease)   . Arthritis   . Rotator cuff tear     left 05/2014  . Colon polyp   . Scoliosis   . Spondylosis of lumbar joint   . Hypertension     Surgeries: Procedure(s): RIGHT TOTAL HIP ARTHROPLASTY ANTERIOR APPROACH on 12/25/2015   Consultants (if any):    Discharged Condition: Improved  Hospital Course: Jean Hudson is an 65 y.o. female who was admitted 12/25/2015 with a diagnosis of <principal problem not specified> and went to the operating room on 12/25/2015 and underwent the above named procedures.    She was given perioperative antibiotics:      Anti-infectives    Start     Dose/Rate Route Frequency Ordered Stop   12/25/15 1900  ceFAZolin (ANCEF) IVPB 1 g/50 mL premix     1 g 100 mL/hr over 30 Minutes Intravenous Every 6 hours 12/25/15 1701 12/26/15 0442   12/25/15 1043  ceFAZolin (ANCEF) 2-4 GM/100ML-% IVPB    Comments:  Jean Hudson   : cabinet override      12/25/15 1043 12/25/15 1328   12/24/15 1147  ceFAZolin (ANCEF) IVPB 2g/100 mL premix  Status:  Discontinued     2 g 200 mL/hr over 30 Minutes Intravenous On call to O.R. 12/24/15 1147 12/25/15 1701    .  She was given sequential compression devices, early ambulation, and ASA for DVT prophylaxis.  She  benefited maximally from the hospital stay and there were no complications.    Recent vital signs:  Filed Vitals:   12/26/15 1007 12/26/15 1312  BP: 130/55 110/64  Pulse:  76  Temp:    Resp:  16    Recent laboratory studies:  Lab Results  Component Value Date   HGB 10.6* 12/26/2015   HGB 12.9 12/14/2015   HGB 11.1* 09/22/2015   Lab Results  Component Value Date   WBC 6.6 12/26/2015   PLT 248 12/26/2015   Lab Results  Component Value Date   INR 0.92 09/13/2015   Lab Results  Component Value Date   NA 135 12/26/2015   K 3.6 12/26/2015   CL 102 12/26/2015   CO2 26 12/26/2015   BUN 9 12/26/2015   CREATININE 0.75 12/26/2015   GLUCOSE 90 12/26/2015    Discharge Medications:     Medication List    STOP taking these medications        fluocinonide 0.05 % external solution  Commonly known as:  LIDEX      TAKE these medications        albuterol 108 (90 Base) MCG/ACT inhaler  Commonly known as:  PROVENTIL HFA;VENTOLIN HFA  Inhale 2 puffs into the lungs every 4 (four) hours as  needed for wheezing or shortness of breath. Reported on 08/10/2015     atorvastatin 10 MG tablet  Commonly known as:  LIPITOR  TAKE 1 TABLET BY MOUTH DAILY     cloNIDine 0.1 MG tablet  Commonly known as:  CATAPRES  Take 0.1 mg by mouth 2 (two) times daily.     cyclobenzaprine 10 MG tablet  Commonly known as:  FLEXERIL  Take 10 mg by mouth at bedtime as needed for muscle spasms.     docusate sodium 100 MG capsule  Commonly known as:  COLACE  Take 1 capsule (100 mg total) by mouth 2 (two) times daily.     esomeprazole 20 MG capsule  Commonly known as:  NEXIUM  Take 20 mg by mouth 2 (two) times daily before a meal.     Fluticasone-Salmeterol 250-50 MCG/DOSE Aepb  Commonly known as:  ADVAIR  Inhale 1 puff into the lungs 2 (two) times daily.     HYDROcodone-acetaminophen 5-325 MG tablet  Commonly known as:  NORCO/VICODIN  Take 1-2 tablets by mouth every 4 (four) hours as needed  (breakthrough pain).     losartan 50 MG tablet  Commonly known as:  COZAAR  Take 50 mg by mouth daily.     meloxicam 15 MG tablet  Commonly known as:  MOBIC  Take 15 mg by mouth every morning.     montelukast 10 MG tablet  Commonly known as:  SINGULAIR  Take 10 mg by mouth at bedtime.     ondansetron 4 MG tablet  Commonly known as:  ZOFRAN  Take 1 tablet (4 mg total) by mouth every 6 (six) hours as needed for nausea.     senna 8.6 MG Tabs tablet  Commonly known as:  SENOKOT  Take 2 tablets (17.2 mg total) by mouth at bedtime.        Diagnostic Studies: Dg Pelvis Portable  12/25/2015  CLINICAL DATA:  Post RIGHT hip arthroplasty EXAM: PORTABLE PELVIS 1-2 VIEWS COMPARISON:  Portable exam 1518 hours compared to early intraoperative study of 12/25/2015 FINDINGS: BILATERAL hip prostheses identified. Bones demineralized. No acute fracture, dislocation, or bone destruction. IMPRESSION: RIGHT hip prosthesis without acute complication. Remote LEFT hip arthroplasty. Electronically Signed   By: Lavonia Dana M.D.   On: 12/25/2015 15:33   Dg Hip Operative Unilat W Or W/o Pelvis Right  12/25/2015  CLINICAL DATA:  Arthritis. EXAM: OPERATIVE RIGHT HIP (WITH PELVIS IF PERFORMED) 2 VIEWS TECHNIQUE: Fluoroscopic spot image(s) were submitted for interpretation post-operatively. COMPARISON:  10/21/2012. FINDINGS: Total right hip replacement with good anatomic alignment. No acute abnormality. IMPRESSION: Total right hip replacement with good anatomic alignment. Electronically Signed   By: Marcello Moores  Register   On: 12/25/2015 14:46    Disposition: 01-Home or Self Care  Discharge Instructions    Call MD / Call 911    Complete by:  As directed   If you experience chest pain or shortness of breath, CALL 911 and be transported to the hospital emergency room.  If you develope a fever above 101 F, pus (white drainage) or increased drainage or redness at the wound, or calf pain, call your surgeon's office.      Constipation Prevention    Complete by:  As directed   Drink plenty of fluids.  Prune juice may be helpful.  You may use a stool softener, such as Colace (over the counter) 100 mg twice a day.  Use MiraLax (over the counter) for constipation as needed.  Diet - low sodium heart healthy    Complete by:  As directed      Driving restrictions    Complete by:  As directed   No driving for 6 weeks     Increase activity slowly as tolerated    Complete by:  As directed      Lifting restrictions    Complete by:  As directed   No lifting for 6 weeks     TED hose    Complete by:  As directed   Use stockings (TED hose) for 2 weeks on both leg(s).  You may remove them at night for sleeping.           Follow-up Information    Follow up with Chianna Spirito, Horald Pollen, MD. Schedule an appointment as soon as possible for a visit in 2 weeks.   Specialty:  Orthopedic Surgery   Why:  For wound re-check   Contact information:   [address] [place] [address] [phone]        Signed: Elie Goody 12/26/2015, 5:16 PM

## 2015-12-26 NOTE — Progress Notes (Signed)
   Subjective:  Patient reports pain as mild to moderate.  Denies N/V/CP/SOB.  Objective:   VITALS:   Filed Vitals:   12/25/15 1954 12/25/15 2138 12/25/15 2350 12/26/15 0419  BP: 126/68  104/57 97/40  Pulse: 66 71 71 60  Temp: 97.4 F (36.3 C)  97.8 F (36.6 C) 97.5 F (36.4 C)  TempSrc: Oral   Oral  Resp: 16 18 16 16   Height:      Weight:      SpO2: 98% 93% 99% 100%    ABD soft Sensation intact distally Intact pulses distally Dorsiflexion/Plantar flexion intact Incision: dressing C/D/I Compartment soft   Lab Results  Component Value Date   WBC 6.6 12/26/2015   HGB 10.6* 12/26/2015   HCT 32.2* 12/26/2015   MCV 88.2 12/26/2015   PLT 248 12/26/2015   BMET    Component Value Date/Time   NA 135 12/26/2015 0536   K 3.6 12/26/2015 0536   CL 102 12/26/2015 0536   CO2 26 12/26/2015 0536   GLUCOSE 90 12/26/2015 0536   BUN 9 12/26/2015 0536   CREATININE 0.75 12/26/2015 0536   CALCIUM 8.4* 12/26/2015 0536   GFRNONAA >60 12/26/2015 0536   GFRAA >60 12/26/2015 0536     Assessment/Plan: 1 Day Post-Op   Active Problems:   Primary osteoarthritis of right hip   WBAT with walker PO pain control DVT ppx: ASA, SCDs, TEDs PT/OT Hypotension: will bolus NS ABLA: stable, monitor Dispo: d/c home today after clears therapy as long as BP improves    Nadean Montanaro, Horald Pollen 12/26/2015, 7:19 AM   Rod Can, MD Cell [phone]

## 2015-12-26 NOTE — Evaluation (Addendum)
Physical Therapy Evaluation Patient Details Name: Jean Hudson MRN: [id number] DOB: 11/28/1950 Today's Date: 12/26/2015   History of Present Illness  65 y.o. female now s/p Rt direct anterior THA. PMH: Lt THA, bilateral RTC tears.   Clinical Impression  Pt is s/p direct anterior THA resulting in the deficits listed below (see PT Problem List).  Pt will benefit from skilled PT to increase their independence and safety with mobility to allow discharge to home with family support. Patient denies any questions or concerns following session. PT to continue to follow acutely.         Follow Up Recommendations Supervision for mobility/OOB (pt declined HHPT services)    Equipment Recommendations  None recommended by PT    Recommendations for Other Services       Precautions / Restrictions Precautions Precautions: None Restrictions Weight Bearing Restrictions: Yes RLE Weight Bearing: Weight bearing as tolerated      Mobility  Bed Mobility               General bed mobility comments: up in room with nursing upon arrival, pt reports needing min assist with getting Rt LE off edge of bed. Husband to assist.   Transfers Overall transfer level: Needs assistance Equipment used: Rolling walker (2 wheeled) Transfers: Sit to/from Stand Sit to Stand: Supervision         General transfer comment: pt demonstrating safe technique.   Ambulation/Gait Ambulation/Gait assistance: Supervision Ambulation Distance (Feet): 400 Feet Assistive device: Rolling walker (2 wheeled) Gait Pattern/deviations: Step-through pattern;Decreased weight shift to right Gait velocity: decreased   General Gait Details: even stride length, no loss of balance.   Stairs Stairs: Yes Stairs assistance: Supervision Stair Management: One rail Right;Step to pattern;Forwards Number of Stairs: 5 General stair comments: Pt reports feeling confident with stairs, declined needed to continue to work on.    Wheelchair Mobility    Modified Rankin (Stroke Patients Only)       Balance Overall balance assessment: Needs assistance Sitting-balance support: No upper extremity supported Sitting balance-Leahy Scale: Good     Standing balance support: During functional activity Standing balance-Leahy Scale: Fair Standing balance comment: using rw during ambulation                              Pertinent Vitals/Pain Pain Assessment: 0-10 Pain Score: 3  Pain Location: Rt hip Pain Descriptors / Indicators: Sore Pain Intervention(s): Limited activity within patient's tolerance;Monitored during session    Home Living Family/patient expects to be discharged to:: Private residence Living Arrangements: Spouse/significant other Available Help at Discharge: Family;Available 24 hours/day Type of Home: House Home Access: Stairs to enter Entrance Stairs-Rails: Right Entrance Stairs-Number of Steps: 7 Home Layout: One level        Prior Function Level of Independence: Independent               Hand Dominance        Extremity/Trunk Assessment   Upper Extremity Assessment: Defer to OT evaluation           Lower Extremity Assessment: RLE deficits/detail RLE Deficits / Details: good quad activation, assist needed with hip abduction       Communication   Communication: No difficulties  Cognition Arousal/Alertness: Awake/alert Behavior During Therapy: WFL for tasks assessed/performed Overall Cognitive Status: Within Functional Limits for tasks assessed  General Comments      Exercises Total Joint Exercises Ankle Circles/Pumps: AROM;Both;10 reps Quad Sets: Strengthening;Right;10 reps Short Arc Quad: Strengthening;Right;10 reps Heel Slides: AAROM;Right;10 reps Hip ABduction/ADduction: Strengthening;Right;10 reps (min assist)      Assessment/Plan    PT Assessment Patient needs continued PT services  PT Diagnosis Difficulty  walking   PT Problem List Decreased strength;Decreased range of motion;Decreased activity tolerance;Decreased balance;Decreased mobility  PT Treatment Interventions DME instruction;Gait training;Stair training;Functional mobility training;Therapeutic activities;Therapeutic exercise;Patient/family education   PT Goals (Current goals can be found in the Care Plan section) Acute Rehab PT Goals Patient Stated Goal: go home  PT Goal Formulation: With patient Time For Goal Achievement: 01/09/16 Potential to Achieve Goals: Good    Frequency 7X/week   Barriers to discharge        Co-evaluation               End of Session Equipment Utilized During Treatment: Gait belt Activity Tolerance: Patient tolerated treatment well Patient left: in chair;with call bell/phone within reach Nurse Communication: Mobility status         Time: [id number] PT Time Calculation (min) (ACUTE ONLY): 24 min   Charges:   PT Evaluation $PT Eval Moderate Complexity: 1 Procedure PT Treatments $Gait Training: 8-22 mins   PT G Codes:        Cassell Clement, PT, CSCS Pager [phone] Office [phone]  12/26/2015, 12:43 PM

## 2015-12-26 NOTE — Care Management Important Message (Signed)
Important Message  Patient Details  Name: Jean Hudson MRN: [id number] Date of Birth: 1950-08-20   Medicare Important Message Given:  Yes    Loann Quill 12/26/2015, 8:49 AM

## 2015-12-26 NOTE — Evaluation (Signed)
Occupational Therapy Evaluation Patient Details Name: Jean Hudson MRN: [id number] DOB: 1951/01/31 Today's Date: 12/26/2015    History of Present Illness 65 y.o. female now s/p Rt direct anterior THA. PMH: Lt THA, bilateral RTC tears.    Clinical Impression   Pt doing well. Pt requires sup with ADL and functional mobility, set up with UB  ADLs, sup with toileting, min A with LB dressing. Pt with previous ortho surgery and has all necessary DME and A/E at home. Pt;s husband will be assisting her prn. All education completed and no further acute OT indicated at this time    Follow Up Recommendations  No OT follow up;Supervision - Intermittent    Equipment Recommendations  None recommended by OT    Recommendations for Other Services       Precautions / Restrictions Precautions Precautions: None Restrictions Weight Bearing Restrictions: Yes RLE Weight Bearing: Weight bearing as tolerated      Mobility Bed Mobility Overal bed mobility: Needs Assistance Bed Mobility: Sit to Supine           General bed mobility comments: min assist with getting Rt LE onto bed.  Transfers Overall transfer level: Needs assistance Equipment used: Rolling walker (2 wheeled) Transfers: Sit to/from Stand Sit to Stand: Supervision         General transfer comment: pt demonstrating safe technique.     Balance Overall balance assessment: Needs assistance Sitting-balance support: No upper extremity supported;Feet supported Sitting balance-Leahy Scale: Good     Standing balance support: Bilateral upper extremity supported;During functional activity Standing balance-Leahy Scale: Fair Standing balance comment: using rw during ambulation                             ADL Overall ADL's : Needs assistance/impaired     Grooming: Wash/dry hands;Wash/dry face;Brushing hair;Standing   Upper Body Bathing: Set up;Sitting   Lower Body Bathing: Minimal assistance   Upper Body  Dressing : Set up;Sitting   Lower Body Dressing: Minimal assistance   Toilet Transfer: RW;Ambulation;Comfort height toilet;Grab bars;Supervision/safety   Toileting- Clothing Manipulation and Hygiene: Supervision/safety;Sit to/from stand   Tub/ Banker: 3 in 1;Supervision/safety   Functional mobility during ADLs: Supervision/safety       Vision  wears reading glasses              Pertinent Vitals/Pain Pain Assessment: 0-10 Pain Score: 2  Pain Location: R hip Pain Descriptors / Indicators: Sore Pain Intervention(s): Monitored during session;Repositioned;Premedicated before session     Hand Dominance Right   Extremity/Trunk Assessment Upper Extremity Assessment Upper Extremity Assessment: Overall WFL for tasks assessed   Lower Extremity Assessment Lower Extremity Assessment: Defer to PT evaluation RLE Deficits / Details: good quad activation, assist needed with hip abduction   Cervical / Trunk Assessment Cervical / Trunk Assessment: Normal   Communication Communication Communication: No difficulties   Cognition Arousal/Alertness: Awake/alert Behavior During Therapy: WFL for tasks assessed/performed Overall Cognitive Status: Within Functional Limits for tasks assessed                     General Comments   Pt pleasant and cooperative                Home Living Family/patient expects to be discharged to:: Private residence Living Arrangements: Spouse/significant other Available Help at Discharge: Family;Available 24 hours/day Type of Home: House Home Access: Stairs to enter [employer] of Steps: 7 Entrance Stairs-Rails: Right Home  Layout: One level     Bathroom Shower/Tub: Corporate investment banker: Handicapped height     Home Equipment: Grab bars - toilet;Grab bars - tub/shower;Walker - 2 wheels          Prior Functioning/Environment Level of Independence: Independent             OT  Diagnosis: Acute pain   OT Problem List: Pain;Decreased activity tolerance   OT Treatment/Interventions:      OT Goals(Current goals can be found in the care plan section) Acute Rehab OT Goals Patient Stated Goal: go home today OT Goal Formulation: With patient  OT Frequency:     Barriers to D/C:  none                        End of Session Equipment Utilized During Treatment: Rolling walker;Other (comment) (3 in 1)  Activity Tolerance: Patient tolerated treatment well Patient left: in bed;with call bell/phone within reach   Time: 1241-1302 OT Time Calculation (min): 21 min Charges:  OT General Charges $OT Visit: 1 Procedure OT Evaluation $OT Eval Moderate Complexity: 1 Procedure G-Codes:    Britt Bottom 12/26/2015, 1:55 PM

## 2016-01-08 DIAGNOSIS — Z471 Aftercare following joint replacement surgery: Secondary | ICD-10-CM | POA: Diagnosis not present

## 2016-01-08 DIAGNOSIS — Z96641 Presence of right artificial hip joint: Secondary | ICD-10-CM | POA: Diagnosis not present

## 2016-01-19 DIAGNOSIS — H2513 Age-related nuclear cataract, bilateral: Secondary | ICD-10-CM | POA: Diagnosis not present

## 2016-02-06 DIAGNOSIS — M7631 Iliotibial band syndrome, right leg: Secondary | ICD-10-CM | POA: Diagnosis not present

## 2016-02-06 DIAGNOSIS — Z96641 Presence of right artificial hip joint: Secondary | ICD-10-CM | POA: Diagnosis not present

## 2016-02-06 DIAGNOSIS — Z471 Aftercare following joint replacement surgery: Secondary | ICD-10-CM | POA: Diagnosis not present

## 2016-04-16 DIAGNOSIS — M7632 Iliotibial band syndrome, left leg: Secondary | ICD-10-CM | POA: Diagnosis not present

## 2016-04-16 DIAGNOSIS — Z96642 Presence of left artificial hip joint: Secondary | ICD-10-CM | POA: Diagnosis not present

## 2016-04-16 DIAGNOSIS — Z471 Aftercare following joint replacement surgery: Secondary | ICD-10-CM | POA: Diagnosis not present

## 2016-04-16 DIAGNOSIS — M7062 Trochanteric bursitis, left hip: Secondary | ICD-10-CM | POA: Diagnosis not present

## 2016-05-15 DIAGNOSIS — Z23 Encounter for immunization: Secondary | ICD-10-CM | POA: Diagnosis not present

## 2016-05-22 DIAGNOSIS — K219 Gastro-esophageal reflux disease without esophagitis: Secondary | ICD-10-CM | POA: Diagnosis not present

## 2016-05-31 DIAGNOSIS — Z23 Encounter for immunization: Secondary | ICD-10-CM | POA: Diagnosis not present

## 2016-05-31 DIAGNOSIS — D2272 Melanocytic nevi of left lower limb, including hip: Secondary | ICD-10-CM | POA: Diagnosis not present

## 2016-05-31 DIAGNOSIS — Z85828 Personal history of other malignant neoplasm of skin: Secondary | ICD-10-CM | POA: Diagnosis not present

## 2016-05-31 DIAGNOSIS — L821 Other seborrheic keratosis: Secondary | ICD-10-CM | POA: Diagnosis not present

## 2016-05-31 DIAGNOSIS — D225 Melanocytic nevi of trunk: Secondary | ICD-10-CM | POA: Diagnosis not present

## 2016-05-31 DIAGNOSIS — Z808 Family history of malignant neoplasm of other organs or systems: Secondary | ICD-10-CM | POA: Diagnosis not present

## 2016-06-28 DIAGNOSIS — Z1231 Encounter for screening mammogram for malignant neoplasm of breast: Secondary | ICD-10-CM | POA: Diagnosis not present

## 2016-07-25 DIAGNOSIS — J0100 Acute maxillary sinusitis, unspecified: Secondary | ICD-10-CM | POA: Diagnosis not present

## 2016-08-15 ENCOUNTER — Other Ambulatory Visit: Payer: Self-pay | Admitting: Family Medicine

## 2016-08-15 ENCOUNTER — Ambulatory Visit
Admission: RE | Admit: 2016-08-15 | Discharge: 2016-08-15 | Disposition: A | Discharge status: Home / Self Care | Payer: Medicare Other | Source: Ambulatory Visit | Attending: Family Medicine | Admitting: Family Medicine

## 2016-08-15 DIAGNOSIS — R911 Solitary pulmonary nodule: Secondary | ICD-10-CM

## 2016-08-15 DIAGNOSIS — R938 Abnormal findings on diagnostic imaging of other specified body structures: Secondary | ICD-10-CM | POA: Diagnosis not present

## 2016-08-15 DIAGNOSIS — R918 Other nonspecific abnormal finding of lung field: Secondary | ICD-10-CM | POA: Diagnosis not present

## 2016-08-15 DIAGNOSIS — I10 Essential (primary) hypertension: Secondary | ICD-10-CM | POA: Diagnosis not present

## 2016-08-15 DIAGNOSIS — R05 Cough: Secondary | ICD-10-CM | POA: Diagnosis not present

## 2016-08-15 MED ORDER — IOPAMIDOL (ISOVUE-300) INJECTION 61%
75.0000 mL | Freq: Once | INTRAVENOUS | Status: AC | PRN
Start: 2016-08-15 — End: 2016-08-15
  Administered 2016-08-15: 75 mL via INTRAVENOUS

## 2016-09-25 DIAGNOSIS — Z124 Encounter for screening for malignant neoplasm of cervix: Secondary | ICD-10-CM | POA: Diagnosis not present

## 2016-09-26 DIAGNOSIS — M7632 Iliotibial band syndrome, left leg: Secondary | ICD-10-CM | POA: Diagnosis not present

## 2016-09-26 DIAGNOSIS — Z471 Aftercare following joint replacement surgery: Secondary | ICD-10-CM | POA: Diagnosis not present

## 2016-09-26 DIAGNOSIS — Z96642 Presence of left artificial hip joint: Secondary | ICD-10-CM | POA: Diagnosis not present

## 2016-09-27 ENCOUNTER — Other Ambulatory Visit: Payer: Self-pay | Admitting: Obstetrics & Gynecology

## 2016-09-27 DIAGNOSIS — E2839 Other primary ovarian failure: Secondary | ICD-10-CM

## 2016-10-02 ENCOUNTER — Other Ambulatory Visit: Payer: Medicare Other

## 2016-11-15 ENCOUNTER — Other Ambulatory Visit: Payer: Self-pay | Admitting: Family Medicine

## 2016-11-15 DIAGNOSIS — R9389 Abnormal findings on diagnostic imaging of other specified body structures: Secondary | ICD-10-CM

## 2016-11-18 ENCOUNTER — Ambulatory Visit
Admission: RE | Admit: 2016-11-18 | Discharge: 2016-11-18 | Disposition: A | Discharge status: Home / Self Care | Payer: Medicare Other | Source: Ambulatory Visit | Attending: Family Medicine | Admitting: Family Medicine

## 2016-11-18 DIAGNOSIS — R9389 Abnormal findings on diagnostic imaging of other specified body structures: Secondary | ICD-10-CM

## 2016-11-18 DIAGNOSIS — R918 Other nonspecific abnormal finding of lung field: Secondary | ICD-10-CM | POA: Diagnosis not present

## 2016-11-18 MED ORDER — IOPAMIDOL (ISOVUE-300) INJECTION 61%
75.0000 mL | Freq: Once | INTRAVENOUS | Status: AC | PRN
  Administered 2016-11-18: 75 mL via INTRAVENOUS

## 2016-11-29 DIAGNOSIS — K219 Gastro-esophageal reflux disease without esophagitis: Secondary | ICD-10-CM | POA: Diagnosis not present

## 2016-11-29 DIAGNOSIS — Z86010 Personal history of colonic polyps: Secondary | ICD-10-CM | POA: Diagnosis not present

## 2016-12-04 DIAGNOSIS — K219 Gastro-esophageal reflux disease without esophagitis: Secondary | ICD-10-CM | POA: Diagnosis not present

## 2016-12-05 DIAGNOSIS — J4520 Mild intermittent asthma, uncomplicated: Secondary | ICD-10-CM | POA: Diagnosis not present

## 2016-12-05 DIAGNOSIS — I10 Essential (primary) hypertension: Secondary | ICD-10-CM | POA: Diagnosis not present

## 2016-12-05 DIAGNOSIS — Z86010 Personal history of colonic polyps: Secondary | ICD-10-CM | POA: Diagnosis not present

## 2016-12-05 DIAGNOSIS — Z13820 Encounter for screening for osteoporosis: Secondary | ICD-10-CM | POA: Diagnosis not present

## 2016-12-05 DIAGNOSIS — E7800 Pure hypercholesterolemia, unspecified: Secondary | ICD-10-CM | POA: Diagnosis not present

## 2016-12-05 DIAGNOSIS — K219 Gastro-esophageal reflux disease without esophagitis: Secondary | ICD-10-CM | POA: Diagnosis not present

## 2016-12-05 DIAGNOSIS — J309 Allergic rhinitis, unspecified: Secondary | ICD-10-CM | POA: Diagnosis not present

## 2016-12-12 DIAGNOSIS — Z86010 Personal history of colonic polyps: Secondary | ICD-10-CM | POA: Diagnosis not present

## 2016-12-12 DIAGNOSIS — K219 Gastro-esophageal reflux disease without esophagitis: Secondary | ICD-10-CM | POA: Diagnosis not present

## 2016-12-12 DIAGNOSIS — I10 Essential (primary) hypertension: Secondary | ICD-10-CM | POA: Diagnosis not present

## 2016-12-12 DIAGNOSIS — J4520 Mild intermittent asthma, uncomplicated: Secondary | ICD-10-CM | POA: Diagnosis not present

## 2016-12-12 DIAGNOSIS — E7800 Pure hypercholesterolemia, unspecified: Secondary | ICD-10-CM | POA: Diagnosis not present

## 2016-12-12 DIAGNOSIS — J309 Allergic rhinitis, unspecified: Secondary | ICD-10-CM | POA: Diagnosis not present

## 2017-01-06 DIAGNOSIS — M810 Age-related osteoporosis without current pathological fracture: Secondary | ICD-10-CM | POA: Diagnosis not present

## 2017-01-12 DIAGNOSIS — M545 Low back pain: Secondary | ICD-10-CM | POA: Diagnosis not present

## 2017-01-23 DIAGNOSIS — M899 Disorder of bone, unspecified: Secondary | ICD-10-CM | POA: Diagnosis not present

## 2017-01-23 DIAGNOSIS — M810 Age-related osteoporosis without current pathological fracture: Secondary | ICD-10-CM | POA: Diagnosis not present

## 2017-01-23 DIAGNOSIS — E663 Overweight: Secondary | ICD-10-CM | POA: Diagnosis not present

## 2017-01-23 DIAGNOSIS — M47816 Spondylosis without myelopathy or radiculopathy, lumbar region: Secondary | ICD-10-CM | POA: Diagnosis not present

## 2017-01-23 DIAGNOSIS — Z6826 Body mass index (BMI) 26.0-26.9, adult: Secondary | ICD-10-CM | POA: Diagnosis not present

## 2017-01-23 DIAGNOSIS — N289 Disorder of kidney and ureter, unspecified: Secondary | ICD-10-CM | POA: Diagnosis not present

## 2017-01-24 ENCOUNTER — Other Ambulatory Visit: Payer: Self-pay | Admitting: Family Medicine

## 2017-01-24 DIAGNOSIS — M47816 Spondylosis without myelopathy or radiculopathy, lumbar region: Secondary | ICD-10-CM

## 2017-01-27 ENCOUNTER — Other Ambulatory Visit: Payer: Self-pay | Admitting: Family Medicine

## 2017-01-27 ENCOUNTER — Ambulatory Visit
Admission: RE | Admit: 2017-01-27 | Discharge: 2017-01-27 | Disposition: A | Discharge status: Home / Self Care | Payer: Medicare Other | Source: Ambulatory Visit | Attending: Family Medicine | Admitting: Family Medicine

## 2017-01-27 DIAGNOSIS — N289 Disorder of kidney and ureter, unspecified: Secondary | ICD-10-CM

## 2017-01-27 DIAGNOSIS — N189 Chronic kidney disease, unspecified: Secondary | ICD-10-CM | POA: Diagnosis not present

## 2017-02-02 ENCOUNTER — Ambulatory Visit
Admission: RE | Admit: 2017-02-02 | Discharge: 2017-02-02 | Disposition: A | Discharge status: Home / Self Care | Payer: Medicare Other | Source: Ambulatory Visit | Attending: Family Medicine | Admitting: Family Medicine

## 2017-02-02 DIAGNOSIS — M48061 Spinal stenosis, lumbar region without neurogenic claudication: Secondary | ICD-10-CM | POA: Diagnosis not present

## 2017-02-02 DIAGNOSIS — M47816 Spondylosis without myelopathy or radiculopathy, lumbar region: Secondary | ICD-10-CM

## 2017-02-03 ENCOUNTER — Other Ambulatory Visit: Payer: Medicare Other

## 2017-02-03 DIAGNOSIS — Z86010 Personal history of colonic polyps: Secondary | ICD-10-CM | POA: Diagnosis not present

## 2017-02-03 DIAGNOSIS — K219 Gastro-esophageal reflux disease without esophagitis: Secondary | ICD-10-CM | POA: Diagnosis not present

## 2017-02-18 DIAGNOSIS — M545 Low back pain: Secondary | ICD-10-CM | POA: Diagnosis not present

## 2017-02-18 DIAGNOSIS — M4125 Other idiopathic scoliosis, thoracolumbar region: Secondary | ICD-10-CM | POA: Diagnosis not present

## 2017-02-19 DIAGNOSIS — N289 Disorder of kidney and ureter, unspecified: Secondary | ICD-10-CM | POA: Diagnosis not present

## 2017-02-19 DIAGNOSIS — E663 Overweight: Secondary | ICD-10-CM | POA: Diagnosis not present

## 2017-02-19 DIAGNOSIS — Z23 Encounter for immunization: Secondary | ICD-10-CM | POA: Diagnosis not present

## 2017-02-19 DIAGNOSIS — M8000XD Age-related osteoporosis with current pathological fracture, unspecified site, subsequent encounter for fracture with routine healing: Secondary | ICD-10-CM | POA: Diagnosis not present

## 2017-02-19 DIAGNOSIS — M5416 Radiculopathy, lumbar region: Secondary | ICD-10-CM | POA: Diagnosis not present

## 2017-02-19 DIAGNOSIS — Z6826 Body mass index (BMI) 26.0-26.9, adult: Secondary | ICD-10-CM | POA: Diagnosis not present

## 2017-05-28 DIAGNOSIS — N289 Disorder of kidney and ureter, unspecified: Secondary | ICD-10-CM | POA: Diagnosis not present

## 2017-05-28 DIAGNOSIS — M8000XD Age-related osteoporosis with current pathological fracture, unspecified site, subsequent encounter for fracture with routine healing: Secondary | ICD-10-CM | POA: Diagnosis not present

## 2017-05-28 DIAGNOSIS — E663 Overweight: Secondary | ICD-10-CM | POA: Diagnosis not present

## 2017-05-28 DIAGNOSIS — Z23 Encounter for immunization: Secondary | ICD-10-CM | POA: Diagnosis not present

## 2017-05-28 DIAGNOSIS — M5416 Radiculopathy, lumbar region: Secondary | ICD-10-CM | POA: Diagnosis not present

## 2017-06-04 DIAGNOSIS — Z23 Encounter for immunization: Secondary | ICD-10-CM | POA: Diagnosis not present

## 2017-06-13 DIAGNOSIS — L821 Other seborrheic keratosis: Secondary | ICD-10-CM | POA: Diagnosis not present

## 2017-06-13 DIAGNOSIS — Z23 Encounter for immunization: Secondary | ICD-10-CM | POA: Diagnosis not present

## 2017-06-13 DIAGNOSIS — D225 Melanocytic nevi of trunk: Secondary | ICD-10-CM | POA: Diagnosis not present

## 2017-06-13 DIAGNOSIS — D2272 Melanocytic nevi of left lower limb, including hip: Secondary | ICD-10-CM | POA: Diagnosis not present

## 2017-06-13 DIAGNOSIS — Z808 Family history of malignant neoplasm of other organs or systems: Secondary | ICD-10-CM | POA: Diagnosis not present

## 2017-06-13 DIAGNOSIS — Z85828 Personal history of other malignant neoplasm of skin: Secondary | ICD-10-CM | POA: Diagnosis not present

## 2017-06-30 DIAGNOSIS — Z1231 Encounter for screening mammogram for malignant neoplasm of breast: Secondary | ICD-10-CM | POA: Diagnosis not present

## 2017-08-07 DIAGNOSIS — L299 Pruritus, unspecified: Secondary | ICD-10-CM | POA: Diagnosis not present

## 2017-08-07 DIAGNOSIS — Z9109 Other allergy status, other than to drugs and biological substances: Secondary | ICD-10-CM | POA: Diagnosis not present

## 2017-09-17 DIAGNOSIS — M545 Low back pain: Secondary | ICD-10-CM | POA: Diagnosis not present

## 2017-09-23 DIAGNOSIS — M4125 Other idiopathic scoliosis, thoracolumbar region: Secondary | ICD-10-CM | POA: Diagnosis not present

## 2017-09-23 DIAGNOSIS — I10 Essential (primary) hypertension: Secondary | ICD-10-CM | POA: Diagnosis not present

## 2017-10-29 DIAGNOSIS — M4125 Other idiopathic scoliosis, thoracolumbar region: Secondary | ICD-10-CM | POA: Diagnosis not present

## 2017-10-29 DIAGNOSIS — M47817 Spondylosis without myelopathy or radiculopathy, lumbosacral region: Secondary | ICD-10-CM | POA: Diagnosis not present

## 2017-11-17 DIAGNOSIS — M47817 Spondylosis without myelopathy or radiculopathy, lumbosacral region: Secondary | ICD-10-CM | POA: Diagnosis not present

## 2017-11-27 DIAGNOSIS — R194 Change in bowel habit: Secondary | ICD-10-CM | POA: Diagnosis not present

## 2017-11-27 DIAGNOSIS — Z86010 Personal history of colonic polyps: Secondary | ICD-10-CM | POA: Diagnosis not present

## 2017-12-11 DIAGNOSIS — M47817 Spondylosis without myelopathy or radiculopathy, lumbosacral region: Secondary | ICD-10-CM | POA: Diagnosis not present

## 2017-12-18 DIAGNOSIS — M47816 Spondylosis without myelopathy or radiculopathy, lumbar region: Secondary | ICD-10-CM | POA: Diagnosis not present

## 2017-12-18 DIAGNOSIS — M810 Age-related osteoporosis without current pathological fracture: Secondary | ICD-10-CM | POA: Diagnosis not present

## 2017-12-18 DIAGNOSIS — J4520 Mild intermittent asthma, uncomplicated: Secondary | ICD-10-CM | POA: Diagnosis not present

## 2017-12-18 DIAGNOSIS — E7800 Pure hypercholesterolemia, unspecified: Secondary | ICD-10-CM | POA: Diagnosis not present

## 2017-12-18 DIAGNOSIS — I10 Essential (primary) hypertension: Secondary | ICD-10-CM | POA: Diagnosis not present

## 2017-12-18 DIAGNOSIS — Z86010 Personal history of colonic polyps: Secondary | ICD-10-CM | POA: Diagnosis not present

## 2018-01-01 DIAGNOSIS — R194 Change in bowel habit: Secondary | ICD-10-CM | POA: Diagnosis not present

## 2018-01-01 DIAGNOSIS — Z86010 Personal history of colonic polyps: Secondary | ICD-10-CM | POA: Diagnosis not present

## 2018-01-01 DIAGNOSIS — M47817 Spondylosis without myelopathy or radiculopathy, lumbosacral region: Secondary | ICD-10-CM | POA: Diagnosis not present

## 2018-01-29 DIAGNOSIS — M47817 Spondylosis without myelopathy or radiculopathy, lumbosacral region: Secondary | ICD-10-CM | POA: Diagnosis not present

## 2018-03-09 DIAGNOSIS — M4125 Other idiopathic scoliosis, thoracolumbar region: Secondary | ICD-10-CM | POA: Diagnosis not present

## 2018-03-09 DIAGNOSIS — M47817 Spondylosis without myelopathy or radiculopathy, lumbosacral region: Secondary | ICD-10-CM | POA: Diagnosis not present

## 2018-03-12 DIAGNOSIS — I10 Essential (primary) hypertension: Secondary | ICD-10-CM | POA: Diagnosis not present

## 2018-03-12 DIAGNOSIS — Z86010 Personal history of colonic polyps: Secondary | ICD-10-CM | POA: Diagnosis not present

## 2018-03-12 DIAGNOSIS — M810 Age-related osteoporosis without current pathological fracture: Secondary | ICD-10-CM | POA: Diagnosis not present

## 2018-03-12 DIAGNOSIS — J4520 Mild intermittent asthma, uncomplicated: Secondary | ICD-10-CM | POA: Diagnosis not present

## 2018-03-12 DIAGNOSIS — E7800 Pure hypercholesterolemia, unspecified: Secondary | ICD-10-CM | POA: Diagnosis not present

## 2018-03-12 DIAGNOSIS — M47816 Spondylosis without myelopathy or radiculopathy, lumbar region: Secondary | ICD-10-CM | POA: Diagnosis not present

## 2018-03-31 DIAGNOSIS — H2513 Age-related nuclear cataract, bilateral: Secondary | ICD-10-CM | POA: Diagnosis not present

## 2018-05-05 DIAGNOSIS — M47817 Spondylosis without myelopathy or radiculopathy, lumbosacral region: Secondary | ICD-10-CM | POA: Diagnosis not present

## 2018-06-03 DIAGNOSIS — Z23 Encounter for immunization: Secondary | ICD-10-CM | POA: Diagnosis not present

## 2018-07-01 DIAGNOSIS — Z1231 Encounter for screening mammogram for malignant neoplasm of breast: Secondary | ICD-10-CM | POA: Diagnosis not present

## 2018-07-02 DIAGNOSIS — D225 Melanocytic nevi of trunk: Secondary | ICD-10-CM | POA: Diagnosis not present

## 2018-07-02 DIAGNOSIS — Z85828 Personal history of other malignant neoplasm of skin: Secondary | ICD-10-CM | POA: Diagnosis not present

## 2018-07-02 DIAGNOSIS — Z808 Family history of malignant neoplasm of other organs or systems: Secondary | ICD-10-CM | POA: Diagnosis not present

## 2018-07-02 DIAGNOSIS — D2272 Melanocytic nevi of left lower limb, including hip: Secondary | ICD-10-CM | POA: Diagnosis not present

## 2018-07-02 DIAGNOSIS — Z23 Encounter for immunization: Secondary | ICD-10-CM | POA: Diagnosis not present

## 2018-07-02 DIAGNOSIS — L821 Other seborrheic keratosis: Secondary | ICD-10-CM | POA: Diagnosis not present

## 2018-07-22 DIAGNOSIS — T148XXA Other injury of unspecified body region, initial encounter: Secondary | ICD-10-CM | POA: Diagnosis not present

## 2018-07-22 DIAGNOSIS — M47817 Spondylosis without myelopathy or radiculopathy, lumbosacral region: Secondary | ICD-10-CM | POA: Diagnosis not present

## 2018-07-22 DIAGNOSIS — M4125 Other idiopathic scoliosis, thoracolumbar region: Secondary | ICD-10-CM | POA: Diagnosis not present

## 2018-07-23 DIAGNOSIS — S52552A Other extraarticular fracture of lower end of left radius, initial encounter for closed fracture: Secondary | ICD-10-CM | POA: Diagnosis not present

## 2018-08-06 DIAGNOSIS — S52552D Other extraarticular fracture of lower end of left radius, subsequent encounter for closed fracture with routine healing: Secondary | ICD-10-CM | POA: Diagnosis not present

## 2018-08-27 DIAGNOSIS — S52522D Torus fracture of lower end of left radius, subsequent encounter for fracture with routine healing: Secondary | ICD-10-CM | POA: Diagnosis not present

## 2018-09-29 DIAGNOSIS — S52552D Other extraarticular fracture of lower end of left radius, subsequent encounter for closed fracture with routine healing: Secondary | ICD-10-CM | POA: Diagnosis not present

## 2018-10-12 DIAGNOSIS — Z5181 Encounter for therapeutic drug level monitoring: Secondary | ICD-10-CM | POA: Diagnosis not present

## 2018-10-12 DIAGNOSIS — Z79899 Other long term (current) drug therapy: Secondary | ICD-10-CM | POA: Diagnosis not present

## 2018-10-12 DIAGNOSIS — M545 Low back pain: Secondary | ICD-10-CM | POA: Diagnosis not present

## 2018-10-12 DIAGNOSIS — Z79891 Long term (current) use of opiate analgesic: Secondary | ICD-10-CM | POA: Diagnosis not present

## 2018-10-22 IMAGING — US US RENAL
1 series · 14 of 25 positions shown · non-contrast
Comparison: None.

CLINICAL DATA: Worsening renal function.

EXAM:
RENAL / URINARY TRACT ULTRASOUND COMPLETE

[Series 1: us renal · 0.23mm/px · 14 of 40 slices shown]
[im 1/40]
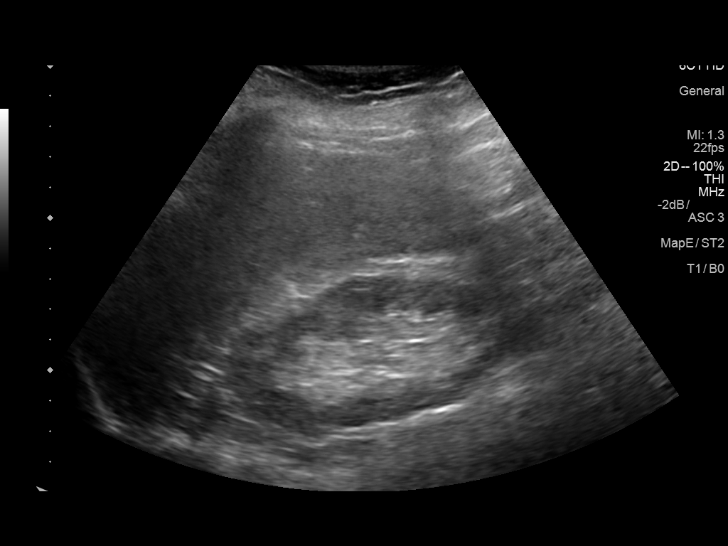
[im 4/40]
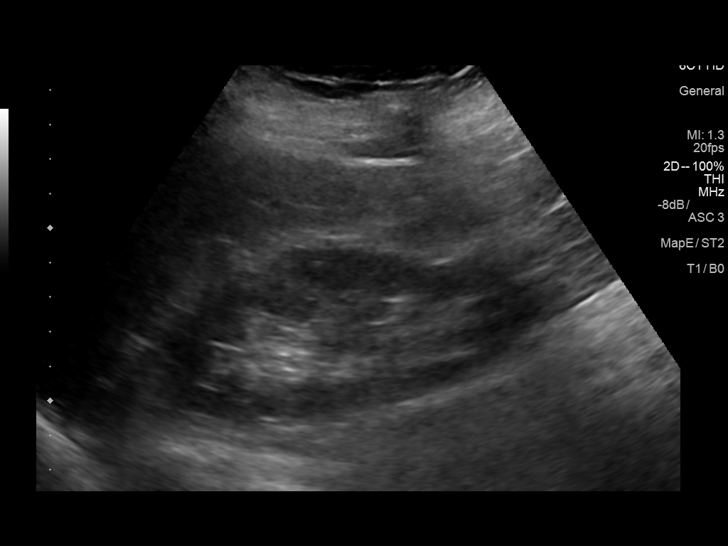
[im 7/40]
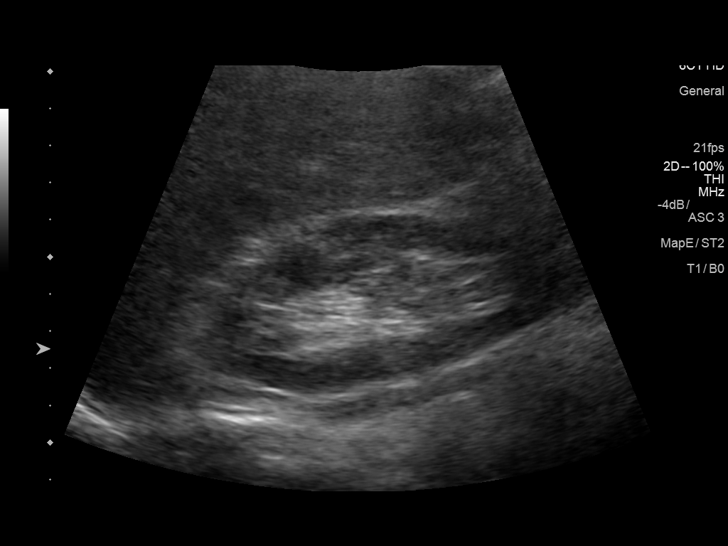
[im 10/40]
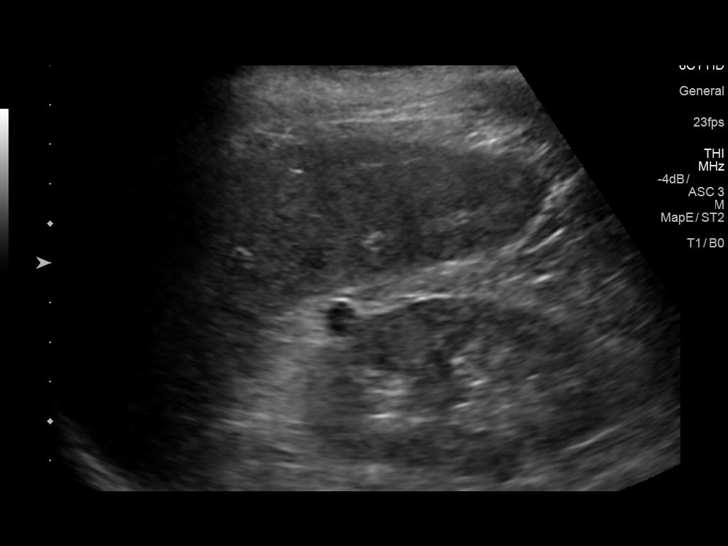
[im 14/40]
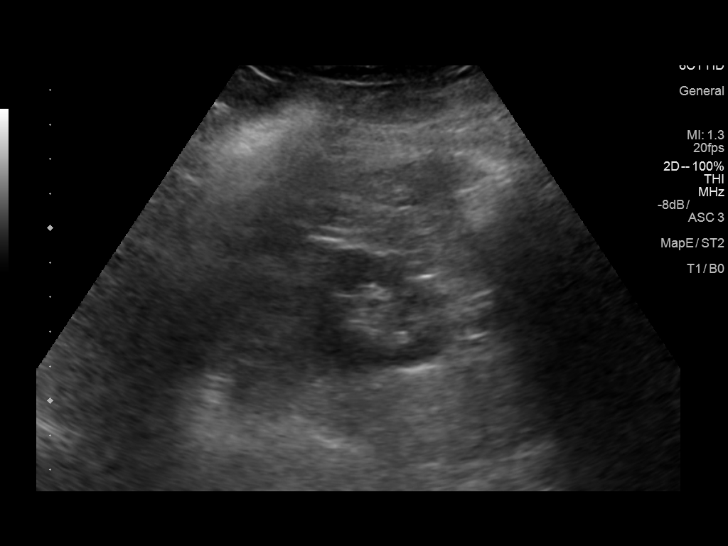
[im 15/40]
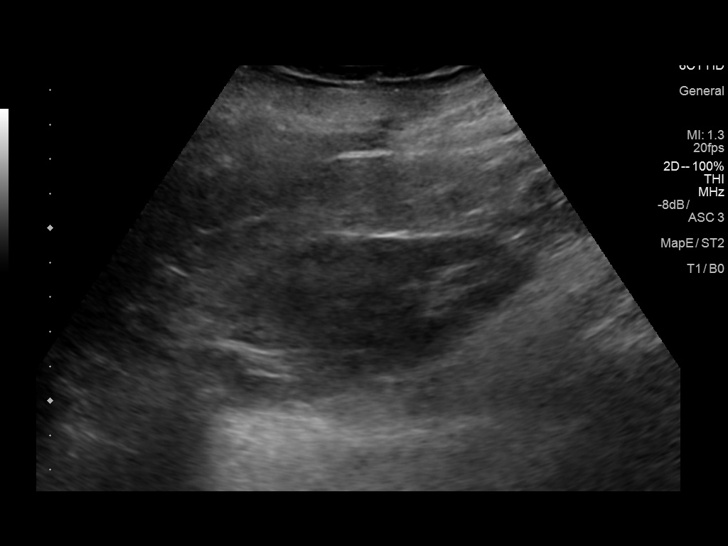
[im 18/40]
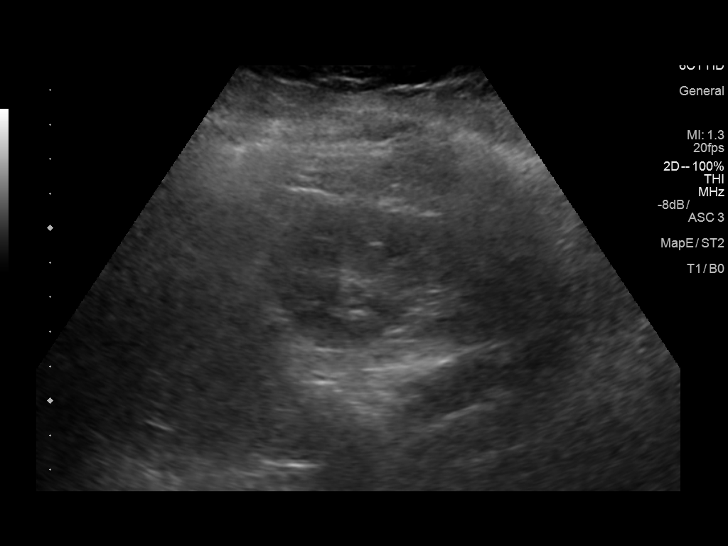
[im 22/40]
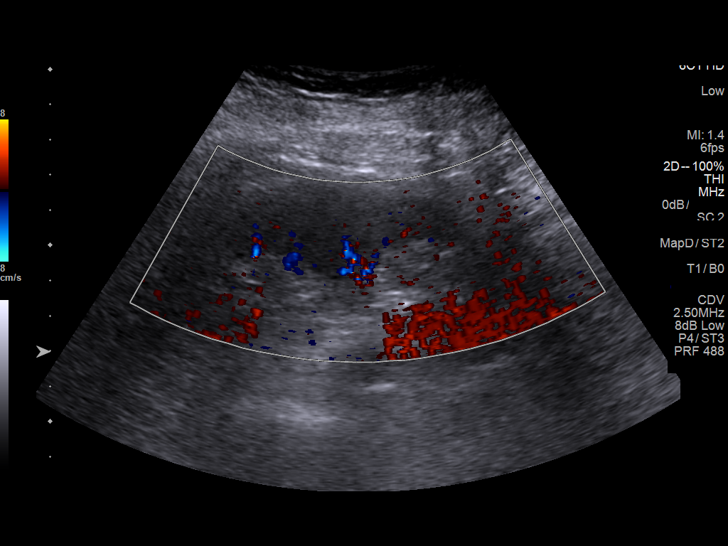
[im 25/40]
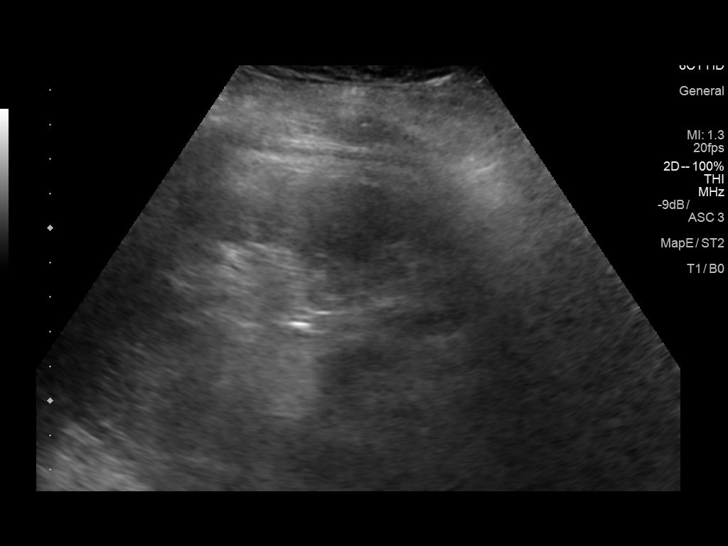
[im 27/40]
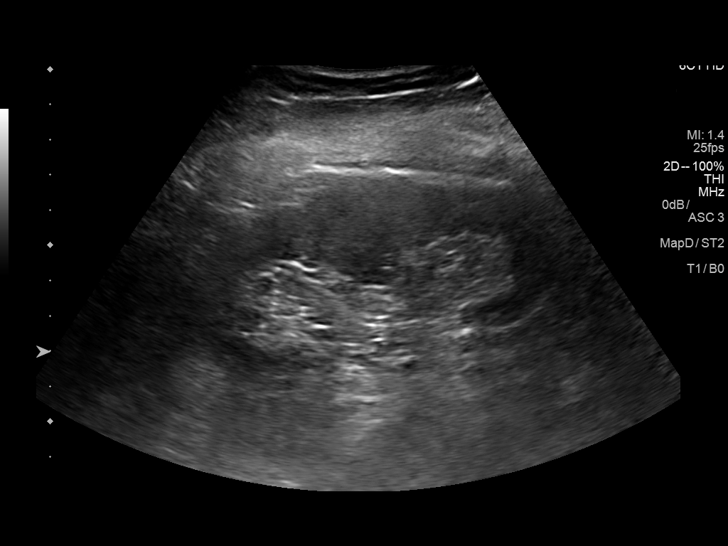
[im 30/40]
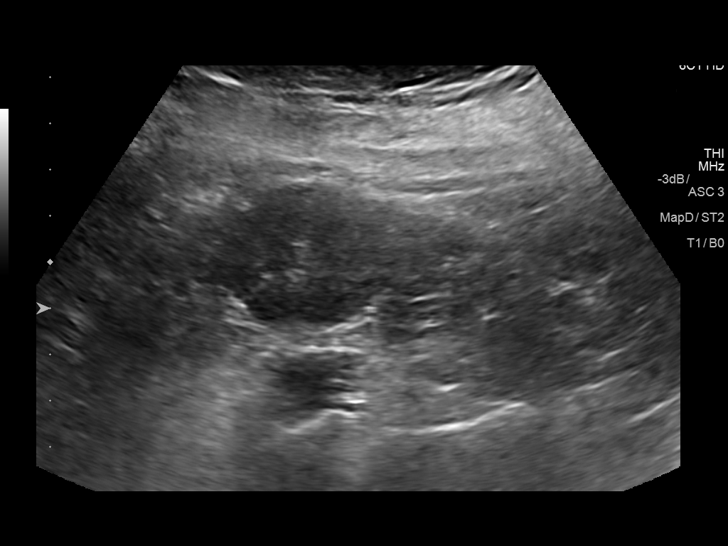
[im 33/40]
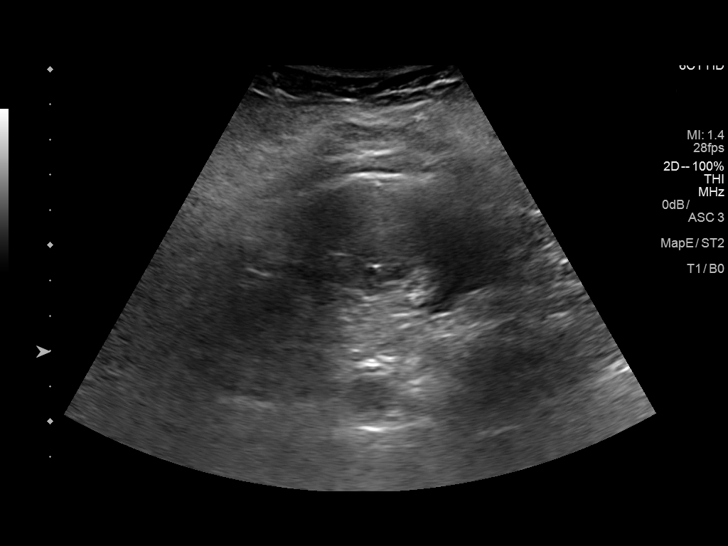
[im 36/40]
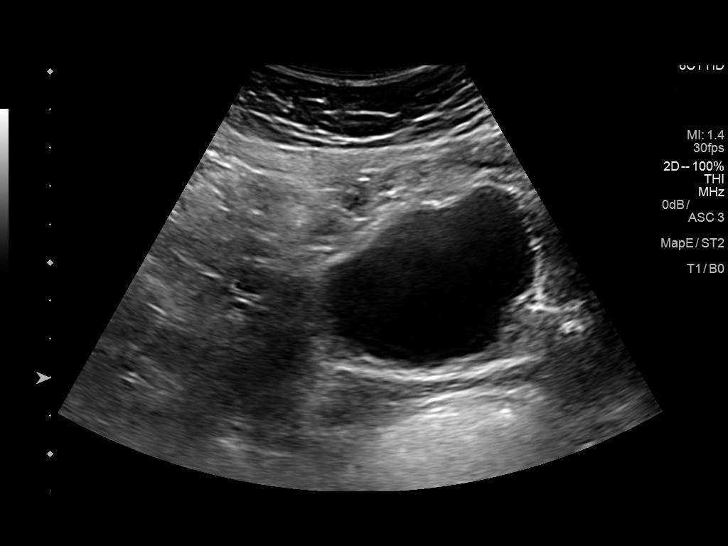
[im 40/40]
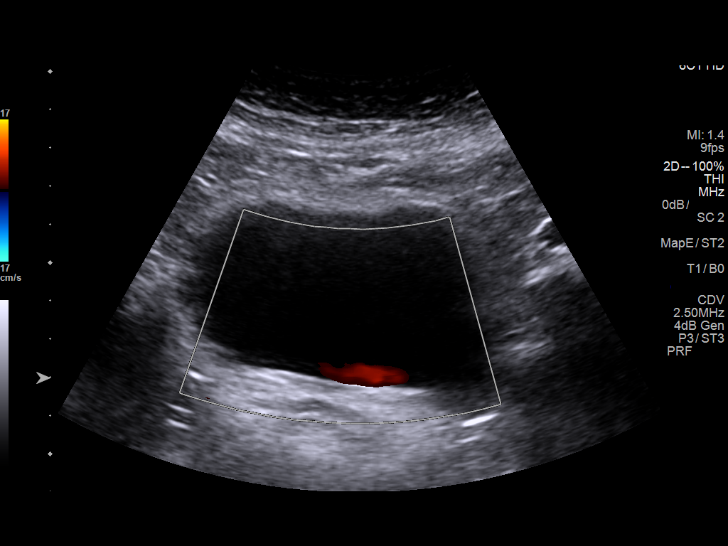

[14 of 25 positions shown; findings below may reference images not displayed]

FINDINGS: Right Kidney:

Length: 11.0 cm. Normal echotexture. 8 mm exophytic upper pole cyst.
No hydronephrosis.

Left Kidney:

Length: 11.6 cm. Echogenicity within normal limits. No mass or
hydronephrosis visualized.

Bladder:

Appears normal for degree of bladder distention.
IMPRESSION: No significant abnormality.

## 2018-12-02 DIAGNOSIS — G5601 Carpal tunnel syndrome, right upper limb: Secondary | ICD-10-CM | POA: Diagnosis not present

## 2018-12-11 DIAGNOSIS — S63501A Unspecified sprain of right wrist, initial encounter: Secondary | ICD-10-CM | POA: Diagnosis not present

## 2018-12-21 IMAGING — CT CT CHEST W/ CM
1 series · 15 of 34 positions shown, 19 images · IV contrast (iopamidol)
Comparison: Chest radiograph 08/15/2016

CLINICAL DATA: Evaluate for pulmonary nodule. Abnormal chest
radiograph.

EXAM:
CT CHEST WITH CONTRAST
TECHNIQUE: Multidetector CT imaging of the chest was performed during
intravenous contrast administration.
CONTRAST:  75mL 67Y9BP-AII IOPAMIDOL (67Y9BP-AII) INJECTION 61%

[Series 2: chest w/cm · axial · 0.68mm/px · z∈[-326,-96]mm · 15 of 136 slices shown, 19 images]
[im 11/136  mediastinal]
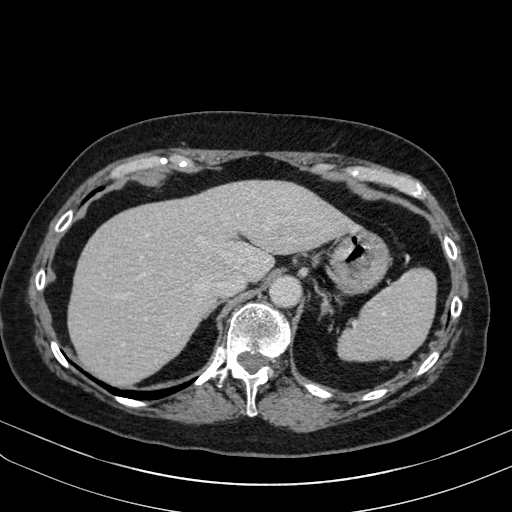
[im 11/136  lung]
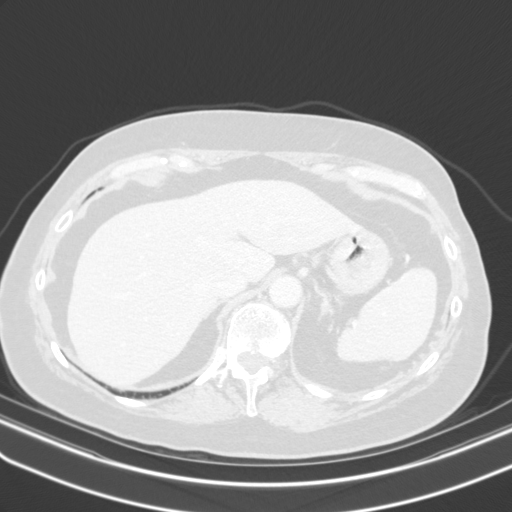
[im 21/136  lung]
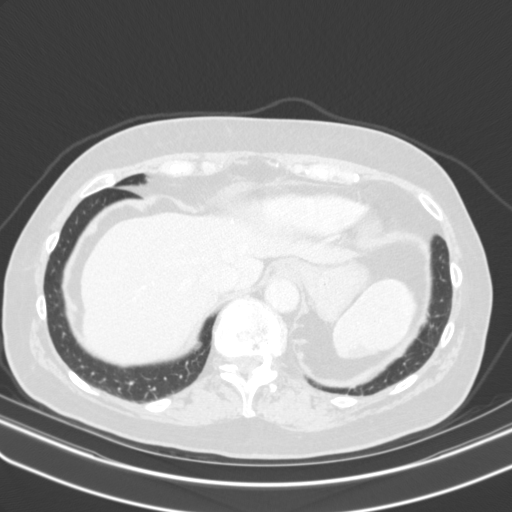
[im 28/136  lung]
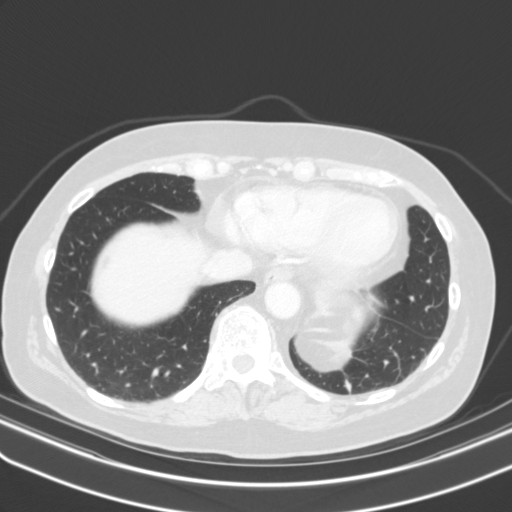
[im 36/136  lung]
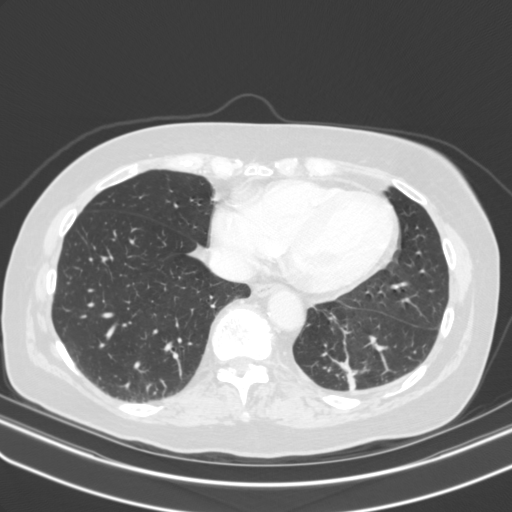
[im 46/136  mediastinal]
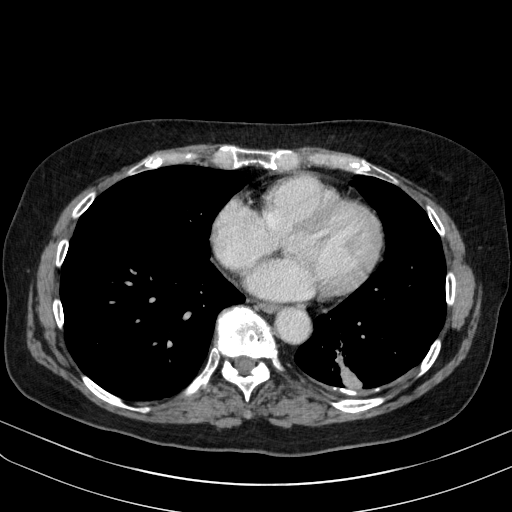
[im 46/136  lung]
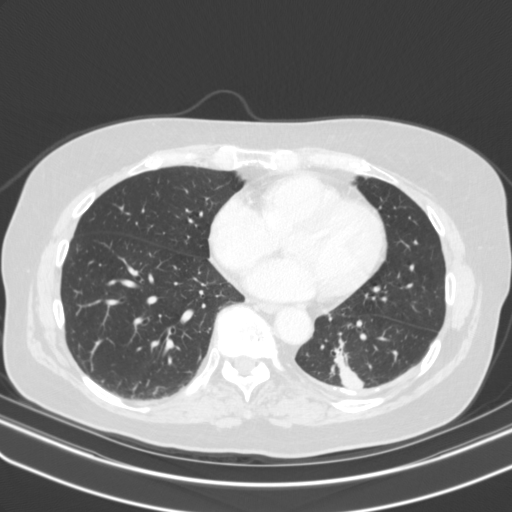
[im 55/136  lung]
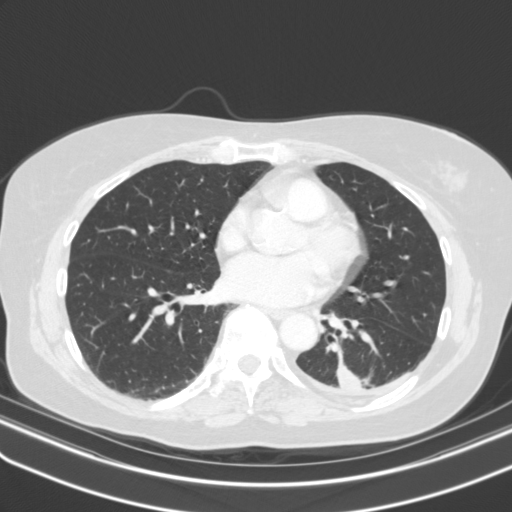
[im 61/136  lung]
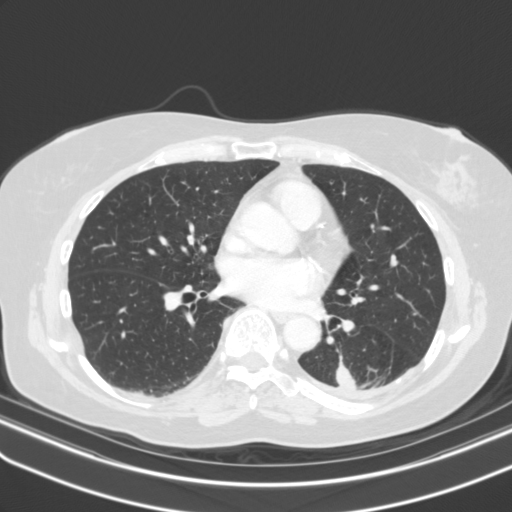
[im 71/136  lung]
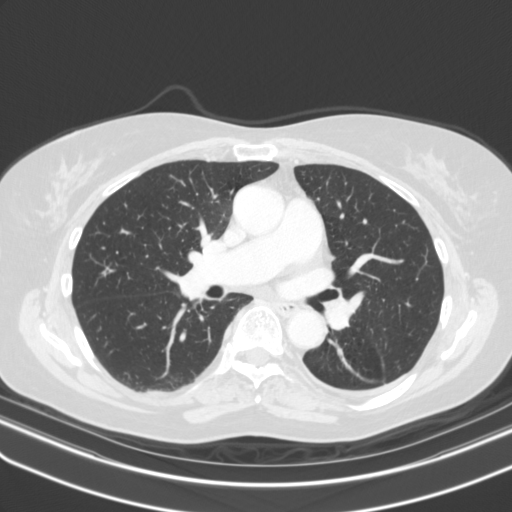
[im 76/136  mediastinal]
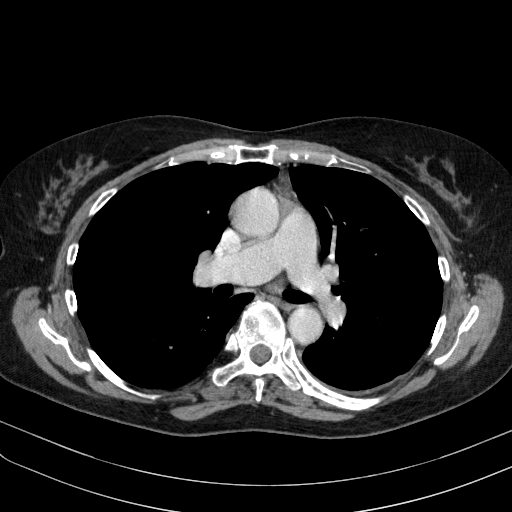
[im 76/136  lung]
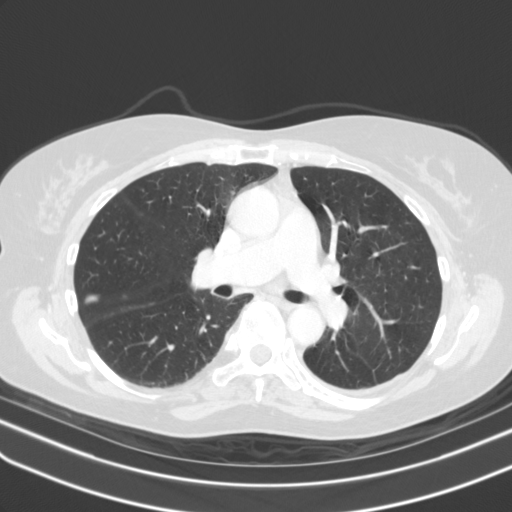
[im 82/136  lung]
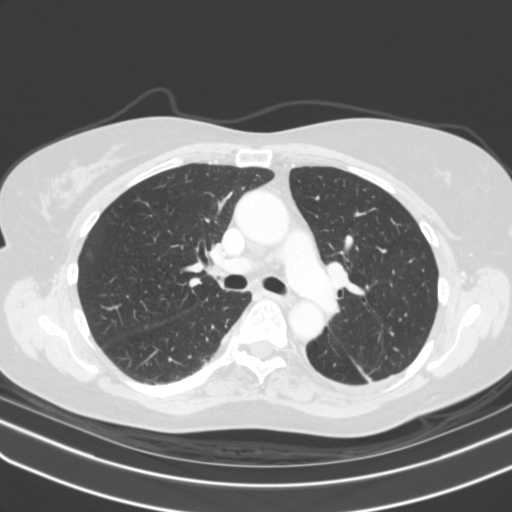
[im 91/136  lung]
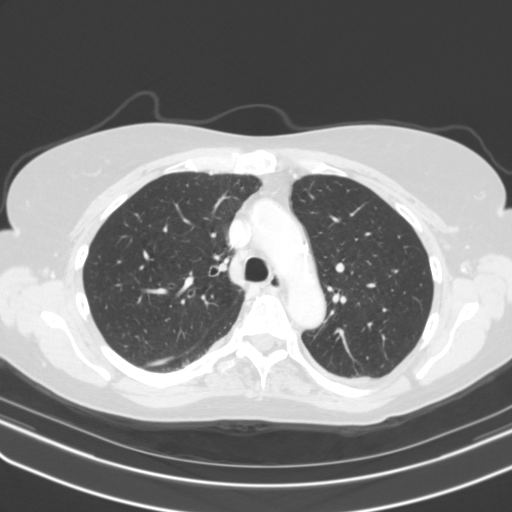
[im 101/136  lung]
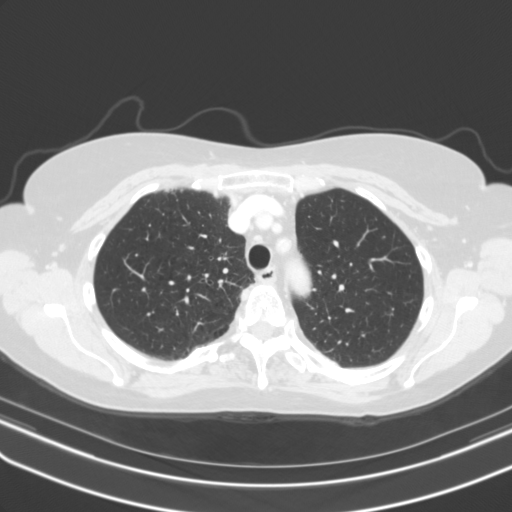
[im 109/136  mediastinal]
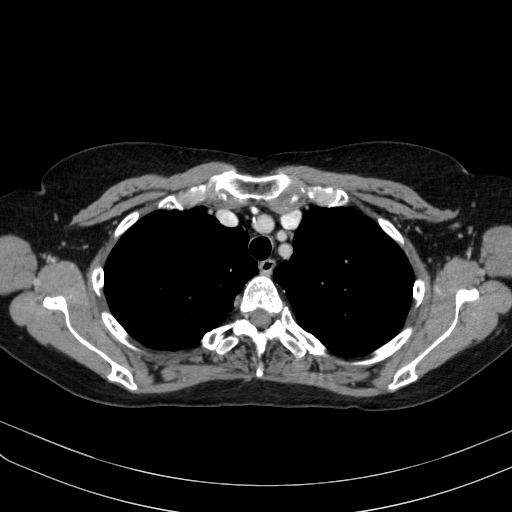
[im 109/136  lung]
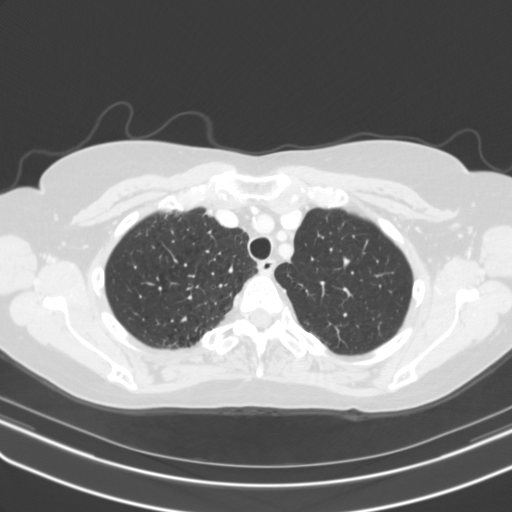
[im 116/136  lung]
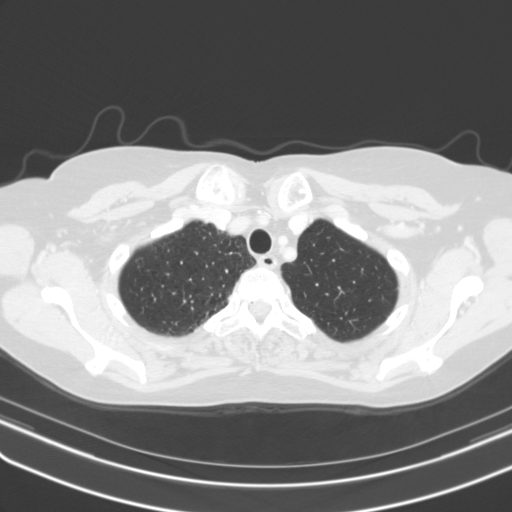
[im 126/136  lung]
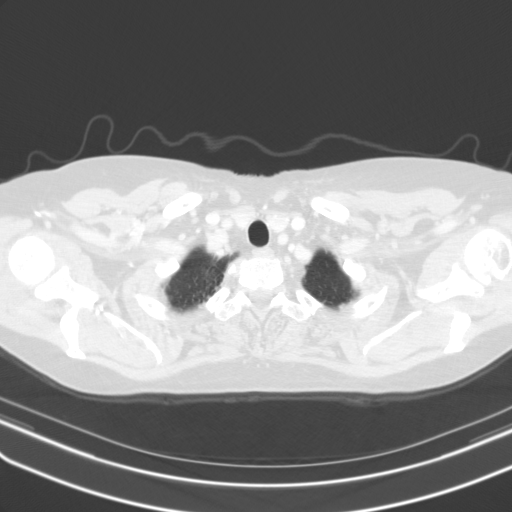

[15 of 34 positions shown; findings below may reference images not displayed]

FINDINGS: Cardiovascular: Heart is mildly enlarged. No pericardial effusion.
Coronary arterial vascular calcifications. Ascending thoracic aorta
measures 3.4 cm.

Mediastinum/Nodes: Esophagus is unremarkable. No enlarged axillary,
mediastinal or hilar lymphadenopathy. Prominent subcentimeter
mediastinal lymph nodes.

Lungs/Pleura: Central airways are patent. Bandlike consolidation
within the left lower hemithorax (image 81; series 5). Centrilobular
and paraseptal emphysematous change. Pleural calcification within
the left hemithorax.

Upper Abdomen: Unremarkable. Minimal nonspecific peripheral low
attenuation within the spleen.

Musculoskeletal: Thoracic spine degenerative changes. No aggressive
or acute appearing osseous lesions.
IMPRESSION: Bandlike consolidation within the left lower hemithorax which may
represent rounded atelectasis or scarring. This is adjacent to an
area of pleural based calcification. Recommend follow-up chest CT in
3 months to assess for interval change/ resolution.

Pleural calcification as can seen with prior asbestos exposure.

## 2018-12-24 DIAGNOSIS — Z86010 Personal history of colonic polyps: Secondary | ICD-10-CM | POA: Diagnosis not present

## 2018-12-24 DIAGNOSIS — I10 Essential (primary) hypertension: Secondary | ICD-10-CM | POA: Diagnosis not present

## 2018-12-24 DIAGNOSIS — G5600 Carpal tunnel syndrome, unspecified upper limb: Secondary | ICD-10-CM | POA: Diagnosis not present

## 2018-12-24 DIAGNOSIS — M25531 Pain in right wrist: Secondary | ICD-10-CM | POA: Diagnosis not present

## 2018-12-24 DIAGNOSIS — J4520 Mild intermittent asthma, uncomplicated: Secondary | ICD-10-CM | POA: Diagnosis not present

## 2018-12-24 DIAGNOSIS — E7800 Pure hypercholesterolemia, unspecified: Secondary | ICD-10-CM | POA: Diagnosis not present

## 2018-12-24 DIAGNOSIS — J309 Allergic rhinitis, unspecified: Secondary | ICD-10-CM | POA: Diagnosis not present

## 2018-12-24 DIAGNOSIS — M47816 Spondylosis without myelopathy or radiculopathy, lumbar region: Secondary | ICD-10-CM | POA: Diagnosis not present

## 2018-12-24 DIAGNOSIS — S63521A Sprain of radiocarpal joint of right wrist, initial encounter: Secondary | ICD-10-CM | POA: Diagnosis not present

## 2018-12-24 DIAGNOSIS — M810 Age-related osteoporosis without current pathological fracture: Secondary | ICD-10-CM | POA: Diagnosis not present

## 2019-01-04 DIAGNOSIS — M545 Low back pain: Secondary | ICD-10-CM | POA: Diagnosis not present

## 2019-01-06 DIAGNOSIS — R05 Cough: Secondary | ICD-10-CM | POA: Diagnosis not present

## 2019-01-07 DIAGNOSIS — R05 Cough: Secondary | ICD-10-CM | POA: Diagnosis not present

## 2019-01-12 DIAGNOSIS — Z86010 Personal history of colonic polyps: Secondary | ICD-10-CM | POA: Diagnosis not present

## 2019-01-12 DIAGNOSIS — R194 Change in bowel habit: Secondary | ICD-10-CM | POA: Diagnosis not present

## 2019-01-25 DIAGNOSIS — K5641 Fecal impaction: Secondary | ICD-10-CM | POA: Diagnosis not present

## 2019-01-25 DIAGNOSIS — R194 Change in bowel habit: Secondary | ICD-10-CM | POA: Diagnosis not present

## 2019-02-16 DIAGNOSIS — Z86010 Personal history of colonic polyps: Secondary | ICD-10-CM | POA: Diagnosis not present

## 2019-02-16 DIAGNOSIS — R197 Diarrhea, unspecified: Secondary | ICD-10-CM | POA: Diagnosis not present

## 2019-02-16 DIAGNOSIS — R194 Change in bowel habit: Secondary | ICD-10-CM | POA: Diagnosis not present

## 2019-03-04 DIAGNOSIS — T148XXA Other injury of unspecified body region, initial encounter: Secondary | ICD-10-CM | POA: Diagnosis not present

## 2019-03-04 DIAGNOSIS — L309 Dermatitis, unspecified: Secondary | ICD-10-CM | POA: Diagnosis not present

## 2019-03-16 DIAGNOSIS — Z96642 Presence of left artificial hip joint: Secondary | ICD-10-CM | POA: Diagnosis not present

## 2019-03-16 DIAGNOSIS — M7062 Trochanteric bursitis, left hip: Secondary | ICD-10-CM | POA: Diagnosis not present

## 2019-03-18 DIAGNOSIS — M7062 Trochanteric bursitis, left hip: Secondary | ICD-10-CM | POA: Diagnosis not present

## 2019-03-24 DIAGNOSIS — G5601 Carpal tunnel syndrome, right upper limb: Secondary | ICD-10-CM | POA: Diagnosis not present

## 2019-03-24 DIAGNOSIS — M79641 Pain in right hand: Secondary | ICD-10-CM | POA: Diagnosis not present

## 2019-03-25 ENCOUNTER — Other Ambulatory Visit: Payer: Self-pay

## 2019-03-25 ENCOUNTER — Encounter: Payer: Self-pay | Admitting: Neurology

## 2019-03-25 DIAGNOSIS — G5601 Carpal tunnel syndrome, right upper limb: Secondary | ICD-10-CM

## 2019-03-25 DIAGNOSIS — R202 Paresthesia of skin: Secondary | ICD-10-CM

## 2019-03-29 DIAGNOSIS — M7062 Trochanteric bursitis, left hip: Secondary | ICD-10-CM | POA: Diagnosis not present

## 2019-04-01 DIAGNOSIS — M7062 Trochanteric bursitis, left hip: Secondary | ICD-10-CM | POA: Diagnosis not present

## 2019-04-01 DIAGNOSIS — M47817 Spondylosis without myelopathy or radiculopathy, lumbosacral region: Secondary | ICD-10-CM | POA: Diagnosis not present

## 2019-04-01 DIAGNOSIS — M4125 Other idiopathic scoliosis, thoracolumbar region: Secondary | ICD-10-CM | POA: Diagnosis not present

## 2019-04-12 DIAGNOSIS — M7062 Trochanteric bursitis, left hip: Secondary | ICD-10-CM | POA: Diagnosis not present

## 2019-04-20 DIAGNOSIS — H2513 Age-related nuclear cataract, bilateral: Secondary | ICD-10-CM | POA: Diagnosis not present

## 2019-04-20 DIAGNOSIS — H524 Presbyopia: Secondary | ICD-10-CM | POA: Diagnosis not present

## 2019-04-20 DIAGNOSIS — H43811 Vitreous degeneration, right eye: Secondary | ICD-10-CM | POA: Diagnosis not present

## 2019-04-20 DIAGNOSIS — H5203 Hypermetropia, bilateral: Secondary | ICD-10-CM | POA: Diagnosis not present

## 2019-04-28 ENCOUNTER — Encounter (HOSPITAL_BASED_OUTPATIENT_CLINIC_OR_DEPARTMENT_OTHER): Payer: Self-pay | Admitting: *Deleted

## 2019-04-28 ENCOUNTER — Other Ambulatory Visit: Payer: Self-pay

## 2019-04-28 ENCOUNTER — Other Ambulatory Visit: Payer: Self-pay | Admitting: Orthopedic Surgery

## 2019-04-28 DIAGNOSIS — G5601 Carpal tunnel syndrome, right upper limb: Secondary | ICD-10-CM | POA: Diagnosis not present

## 2019-04-29 ENCOUNTER — Encounter (HOSPITAL_BASED_OUTPATIENT_CLINIC_OR_DEPARTMENT_OTHER)
Admission: RE | Admit: 2019-04-29 | Discharge: 2019-04-29 | Disposition: A | Discharge status: Home / Self Care | Payer: Medicare Other | Source: Ambulatory Visit | Attending: Orthopedic Surgery | Admitting: Orthopedic Surgery

## 2019-04-29 ENCOUNTER — Other Ambulatory Visit (HOSPITAL_COMMUNITY)
Admission: RE | Admit: 2019-04-29 | Discharge: 2019-04-29 | Disposition: A | Discharge status: Home / Self Care | Payer: Medicare Other | Source: Ambulatory Visit | Attending: Orthopedic Surgery | Admitting: Orthopedic Surgery

## 2019-04-29 ENCOUNTER — Encounter: Payer: Medicare Other | Admitting: Neurology

## 2019-04-29 DIAGNOSIS — Z01812 Encounter for preprocedural laboratory examination: Secondary | ICD-10-CM | POA: Insufficient documentation

## 2019-04-29 DIAGNOSIS — Z20828 Contact with and (suspected) exposure to other viral communicable diseases: Secondary | ICD-10-CM | POA: Diagnosis not present

## 2019-04-29 NOTE — Progress Notes (Signed)

## 2019-05-01 LAB — NOVEL CORONAVIRUS, NAA (HOSP ORDER, SEND-OUT TO REF LAB; TAT 18-24 HRS): SARS-CoV-2, NAA: NOT DETECTED

## 2019-05-03 ENCOUNTER — Ambulatory Visit (HOSPITAL_BASED_OUTPATIENT_CLINIC_OR_DEPARTMENT_OTHER)
Admission: RE | Admit: 2019-05-03 | Discharge: 2019-05-03 | Disposition: A | Discharge status: Home / Self Care | Payer: Medicare Other | Attending: Orthopedic Surgery | Admitting: Orthopedic Surgery

## 2019-05-03 ENCOUNTER — Ambulatory Visit (HOSPITAL_BASED_OUTPATIENT_CLINIC_OR_DEPARTMENT_OTHER): Payer: Medicare Other | Admitting: Anesthesiology

## 2019-05-03 ENCOUNTER — Encounter (HOSPITAL_BASED_OUTPATIENT_CLINIC_OR_DEPARTMENT_OTHER)
Admission: RE | Disposition: A | Discharge status: Home / Self Care | Payer: Self-pay | Source: Home / Self Care | Attending: Orthopedic Surgery

## 2019-05-03 ENCOUNTER — Other Ambulatory Visit: Payer: Self-pay

## 2019-05-03 ENCOUNTER — Encounter (HOSPITAL_BASED_OUTPATIENT_CLINIC_OR_DEPARTMENT_OTHER): Payer: Self-pay | Admitting: *Deleted

## 2019-05-03 DIAGNOSIS — J45909 Unspecified asthma, uncomplicated: Secondary | ICD-10-CM | POA: Insufficient documentation

## 2019-05-03 DIAGNOSIS — Z79899 Other long term (current) drug therapy: Secondary | ICD-10-CM | POA: Diagnosis not present

## 2019-05-03 DIAGNOSIS — Z96643 Presence of artificial hip joint, bilateral: Secondary | ICD-10-CM | POA: Insufficient documentation

## 2019-05-03 DIAGNOSIS — G5601 Carpal tunnel syndrome, right upper limb: Secondary | ICD-10-CM | POA: Insufficient documentation

## 2019-05-03 DIAGNOSIS — Z87891 Personal history of nicotine dependence: Secondary | ICD-10-CM | POA: Insufficient documentation

## 2019-05-03 DIAGNOSIS — Z7951 Long term (current) use of inhaled steroids: Secondary | ICD-10-CM | POA: Diagnosis not present

## 2019-05-03 DIAGNOSIS — I10 Essential (primary) hypertension: Secondary | ICD-10-CM | POA: Insufficient documentation

## 2019-05-03 DIAGNOSIS — E785 Hyperlipidemia, unspecified: Secondary | ICD-10-CM | POA: Diagnosis not present

## 2019-05-03 HISTORY — PX: CARPAL TUNNEL RELEASE: SHX101

## 2019-05-03 SURGERY — CARPAL TUNNEL RELEASE
Anesthesia: Monitor Anesthesia Care | Site: Wrist | Laterality: Right

## 2019-05-03 MED ORDER — HYDROCODONE-ACETAMINOPHEN 5-325 MG PO TABS: ORAL_TABLET | ORAL | 0 refills | Status: AC

## 2019-05-03 MED ORDER — ONDANSETRON HCL 4 MG/2ML IJ SOLN
INTRAMUSCULAR | Status: DC | PRN
  Administered 2019-05-03: 4 mg via INTRAVENOUS

## 2019-05-03 MED ORDER — LACTATED RINGERS IV SOLN
INTRAVENOUS | Status: DC
  Administered 2019-05-03: 15:00:00 via INTRAVENOUS

## 2019-05-03 MED ORDER — OXYCODONE HCL 5 MG/5ML PO SOLN: 5.0000 mg | Freq: Once | ORAL | Status: DC | PRN

## 2019-05-03 MED ORDER — FENTANYL CITRATE (PF) 100 MCG/2ML IJ SOLN: 25.0000 ug | INTRAMUSCULAR | Status: DC | PRN

## 2019-05-03 MED ORDER — ACETAMINOPHEN 160 MG/5ML PO SOLN: 325.0000 mg | ORAL | Status: DC | PRN

## 2019-05-03 MED ORDER — MIDAZOLAM HCL 2 MG/2ML IJ SOLN
1.0000 mg | INTRAMUSCULAR | Status: DC | PRN
  Administered 2019-05-03 (×2): 1 mg via INTRAVENOUS

## 2019-05-03 MED ORDER — OXYCODONE HCL 5 MG PO TABS: 5.0000 mg | ORAL_TABLET | Freq: Once | ORAL | Status: DC | PRN

## 2019-05-03 MED ORDER — ONDANSETRON HCL 4 MG/2ML IJ SOLN: 4.0000 mg | Freq: Once | INTRAMUSCULAR | Status: DC | PRN

## 2019-05-03 MED ORDER — LIDOCAINE HCL (PF) 0.5 % IJ SOLN
INTRAMUSCULAR | Status: DC | PRN
  Administered 2019-05-03: 30 mL via INTRAVENOUS

## 2019-05-03 MED ORDER — PROPOFOL 500 MG/50ML IV EMUL
INTRAVENOUS | Status: DC | PRN
  Administered 2019-05-03: 25 ug/kg/min via INTRAVENOUS

## 2019-05-03 MED ORDER — CEFAZOLIN SODIUM-DEXTROSE 2-4 GM/100ML-% IV SOLN
2.0000 g | INTRAVENOUS | Status: AC
  Administered 2019-05-03: 2 g via INTRAVENOUS

## 2019-05-03 MED ORDER — BUPIVACAINE HCL (PF) 0.25 % IJ SOLN
INTRAMUSCULAR | Status: DC | PRN
  Administered 2019-05-03: 10 mL

## 2019-05-03 MED ORDER — FENTANYL CITRATE (PF) 100 MCG/2ML IJ SOLN
INTRAMUSCULAR | Status: AC
  Filled 2019-05-03: qty 2

## 2019-05-03 MED ORDER — CHLORHEXIDINE GLUCONATE 4 % EX LIQD: 60.0000 mL | Freq: Once | CUTANEOUS | Status: DC

## 2019-05-03 MED ORDER — PROPOFOL 10 MG/ML IV BOLUS
INTRAVENOUS | Status: DC | PRN
  Administered 2019-05-03 (×2): 10 mg via INTRAVENOUS
  Administered 2019-05-03: 20 mg via INTRAVENOUS

## 2019-05-03 MED ORDER — CEFAZOLIN SODIUM-DEXTROSE 2-4 GM/100ML-% IV SOLN
INTRAVENOUS | Status: AC
  Filled 2019-05-03: qty 100

## 2019-05-03 MED ORDER — MIDAZOLAM HCL 2 MG/2ML IJ SOLN
INTRAMUSCULAR | Status: AC
  Filled 2019-05-03: qty 2

## 2019-05-03 MED ORDER — MEPERIDINE HCL 25 MG/ML IJ SOLN: 6.2500 mg | INTRAMUSCULAR | Status: DC | PRN

## 2019-05-03 MED ORDER — FENTANYL CITRATE (PF) 100 MCG/2ML IJ SOLN
50.0000 ug | INTRAMUSCULAR | Status: DC | PRN
  Administered 2019-05-03 (×2): 50 ug via INTRAVENOUS

## 2019-05-03 MED ORDER — ACETAMINOPHEN 325 MG PO TABS: 325.0000 mg | ORAL_TABLET | ORAL | Status: DC | PRN

## 2019-05-03 SURGICAL SUPPLY — 36 items
APL PRP STRL LF DISP 70% ISPRP (MISCELLANEOUS) ×1
BLADE SURG 15 STRL LF DISP TIS (BLADE) ×2 IMPLANT
BLADE SURG 15 STRL SS (BLADE) ×6
BNDG CMPR 9X4 STRL LF SNTH (GAUZE/BANDAGES/DRESSINGS)
BNDG ELASTIC 3X5.8 VLCR STR LF (GAUZE/BANDAGES/DRESSINGS) ×3 IMPLANT
BNDG ESMARK 4X9 LF (GAUZE/BANDAGES/DRESSINGS) IMPLANT
BNDG GAUZE ELAST 4 BULKY (GAUZE/BANDAGES/DRESSINGS) ×3 IMPLANT
CHLORAPREP W/TINT 26 (MISCELLANEOUS) ×3 IMPLANT
CORD BIPOLAR FORCEPS 12FT (ELECTRODE) ×3 IMPLANT
COVER BACK TABLE REUSABLE LG (DRAPES) ×3 IMPLANT
COVER MAYO STAND REUSABLE (DRAPES) ×3 IMPLANT
COVER WAND RF STERILE (DRAPES) IMPLANT
CUFF TOURN SGL QUICK 18X4 (TOURNIQUET CUFF) ×3 IMPLANT
DRAPE EXTREMITY T 121X128X90 (DISPOSABLE) ×3 IMPLANT
DRAPE SURG 17X23 STRL (DRAPES) ×3 IMPLANT
DRSG PAD ABDOMINAL 8X10 ST (GAUZE/BANDAGES/DRESSINGS) ×3 IMPLANT
GAUZE SPONGE 4X4 12PLY STRL (GAUZE/BANDAGES/DRESSINGS) ×3 IMPLANT
GAUZE XEROFORM 1X8 LF (GAUZE/BANDAGES/DRESSINGS) ×3 IMPLANT
GLOVE BIO SURGEON STRL SZ7.5 (GLOVE) ×3 IMPLANT
GLOVE BIOGEL PI IND STRL 8 (GLOVE) ×1 IMPLANT
GLOVE BIOGEL PI INDICATOR 8 (GLOVE) ×2
GLOVE SURG SS PI 6.5 STRL IVOR (GLOVE) ×3 IMPLANT
GOWN STRL REUS W/ TWL LRG LVL3 (GOWN DISPOSABLE) ×1 IMPLANT
GOWN STRL REUS W/TWL LRG LVL3 (GOWN DISPOSABLE) ×3
GOWN STRL REUS W/TWL XL LVL3 (GOWN DISPOSABLE) ×3 IMPLANT
NEEDLE HYPO 25X1 1.5 SAFETY (NEEDLE) ×3 IMPLANT
NS IRRIG 1000ML POUR BTL (IV SOLUTION) ×3 IMPLANT
PACK BASIN DAY SURGERY FS (CUSTOM PROCEDURE TRAY) ×3 IMPLANT
PADDING CAST ABS 4INX4YD NS (CAST SUPPLIES) ×2
PADDING CAST ABS COTTON 4X4 ST (CAST SUPPLIES) ×1 IMPLANT
STOCKINETTE 4X48 STRL (DRAPES) ×3 IMPLANT
SUT ETHILON 4 0 PS 2 18 (SUTURE) ×3 IMPLANT
SYR BULB 3OZ (MISCELLANEOUS) ×3 IMPLANT
SYR CONTROL 10ML LL (SYRINGE) ×3 IMPLANT
TOWEL GREEN STERILE FF (TOWEL DISPOSABLE) ×6 IMPLANT
UNDERPAD 30X36 HEAVY ABSORB (UNDERPADS AND DIAPERS) ×3 IMPLANT

## 2019-05-03 NOTE — Op Note (Signed)
05/03/2019 [facility]                              OPERATIVE REPORT   PREOPERATIVE DIAGNOSIS:  Right carpal tunnel syndrome.  POSTOPERATIVE DIAGNOSIS:  Right carpal tunnel syndrome.  PROCEDURE:  Right carpal tunnel release.  SURGEON:  Leanora Cover, MD  ASSISTANT:  none.  ANESTHESIA: Bier block with sedation  IV FLUIDS:  Per anesthesia flow sheet.  ESTIMATED BLOOD LOSS:  Minimal.  COMPLICATIONS:  None.  SPECIMENS:  None.  TOURNIQUET TIME:    Total Tourniquet Time Documented: Forearm (Right) - 24 minutes Total: Forearm (Right) - 24 minutes   DISPOSITION:  Stable to PACU.  LOCATION: [facility]  INDICATIONS:  68 yo female with numbness and tingling right hand.  Positive nerve conduction studies.  She wishes to have a carpal tunnel release for management of her symptoms.  Risks, benefits and alternatives of surgery were discussed including the risk of blood loss; infection; damage to nerves, vessels, tendons, ligaments, bone; failure of surgery; need for additional surgery; complications with wound healing; continued pain; recurrence of carpal tunnel syndrome; and damage to motor branch. She voiced understanding of these risks and elected to proceed.   OPERATIVE COURSE:  After being identified preoperatively by myself, the patient and I agreed upon the procedure and site of procedure.  The surgical site was marked.  The risks, benefits, and alternatives of the surgery were reviewed and she wished to proceed.  Surgical consent had been signed.  She was given IV Ancef as preoperative antibiotic prophylaxis.  She was transferred to the operating room and placed on the operating room table in supine position with the Right upper extremity on an armboard.  Bier block anesthesia was induced by the anesthesiologist.  Right upper extremity was prepped and draped in normal sterile orthopaedic fashion.  A surgical pause was performed between the surgeons,  anesthesia, and operating room staff, and all were in agreement as to the patient, procedure, and site of procedure.  Tourniquet at the proximal aspect of the forearm had been inflated for the Bier block  Incision was made over the transverse carpal ligament and carried into the subcutaneous tissues by spreading technique.  Bipolar electrocautery was used to obtain hemostasis.  The palmar fascia was sharply incised.  The transverse carpal ligament was identified and sharply incised.  It was incised distally first.  Care was taken to ensure complete decompression distally.  It was then incised proximally.  Scissors were used to split the distal aspect of the volar antebrachial fascia.  A finger was placed into the wound to ensure complete decompression, which was the case.  The nerve was examined.  It was adherent to the radial leaflet.  The motor branch was identified and was intact.  The wound was copiously irrigated with sterile saline.  It was then closed with 4-0 nylon in a horizontal mattress fashion.  It was injected with 0.25% plain Marcaine to aid in postoperative analgesia.  It was dressed with sterile Xeroform, 4x4s, an ABD, and wrapped with Kerlix and an Ace bandage.  Tourniquet was deflated at 24 minutes.  Fingertips were pink with brisk capillary refill after deflation of the tourniquet.  Operative drapes were broken down.  The patient was awoken from anesthesia safely.  She was transferred back to stretcher and taken to the PACU in stable condition.  I will see her back in the office  in 1 week for postoperative followup.  I will give her a prescription for Norco 5/325 1-2 tabs PO q6 hours prn pain, dispense # 20.    Leanora Cover, MD Electronically signed, 05/03/19

## 2019-05-03 NOTE — Anesthesia Procedure Notes (Signed)
Anesthesia Regional Block: Bier block (IV Regional)   Pre-Anesthetic Checklist: ,, timeout performed, Correct Patient, Correct Site, Correct Laterality, Correct Procedure,, site marked, surgical consent,, at surgeon's request  Laterality: Right     Needles:  Injection technique: Single-shot  Needle Type: Other      Needle Gauge: 22     Additional Needles:   Procedures:,,,,, intact distal pulses, Esmarch exsanguination, single tourniquet utilized,  Narrative:  Start time: 05/03/2019 3:27 PM End time: 05/03/2019 3:27 PM  Performed by: Personally

## 2019-05-03 NOTE — Transfer of Care (Signed)
Immediate Anesthesia Transfer of Care Note  Patient: Jean Hudson  Procedure(s) Performed: RIGHT CARPAL TUNNEL RELEASE (Right Wrist)  Patient Location: PACU  Anesthesia Type:MAC and Bier block  Level of Consciousness: sedated  Airway & Oxygen Therapy: Patient Spontanous Breathing  Post-op Assessment: Report given to RN and Post -op Vital signs reviewed and stable  Post vital signs: Reviewed and stable  Last Vitals:  Vitals Value Taken Time  BP 112/54 05/03/19 1556  Temp    Pulse 56 05/03/19 1558  Resp 10 05/03/19 1558  SpO2 99 % 05/03/19 1558  Vitals shown include unvalidated device data.  Last Pain:  Vitals:   05/03/19 1306  TempSrc: Oral  PainSc: 0-No pain      Patients Stated Pain Goal: 3 (10/93/23 5573)  Complications: No apparent anesthesia complications

## 2019-05-03 NOTE — Discharge Instructions (Addendum)

## 2019-05-03 NOTE — H&P (Signed)
Jean Hudson is an 68 y.o. female.   Chief Complaint: right carpal tunnel syndrome HPI: 68 yo female with numbness and tingling right hand.  Positive nerve conduction studies.  Nocturnal symptoms.  She wishes to have a right carpal tunnel release.  Allergies:  Allergies  Allergen Reactions  . Demerol Nausea And Vomiting  . Flagyl [Metronidazole Hcl] Swelling and Rash    JOINT SWELLING  . Hydrocodone Itching    Can pre-med with Benadryl    Past Medical History:  Diagnosis Date  . Arthritis   . Asthma   . Colon polyp   . Family history of adverse reaction to anesthesia    pts father agitated following anesthesia;   . GERD (gastroesophageal reflux disease)   . History of bronchitis   . Hyperlipidemia   . Hypertension   . Rotator cuff tear    left 05/2014  . Rotator cuff tear, right 08/2011  . Scoliosis   . Seasonal allergies   . Shortness of breath dyspnea    pt states gets SOB if does not use Advair regularly  . SLAP lesion of shoulder 08/2011   right  . Spondylosis of lumbar joint   . Wears glasses     Past Surgical History:  Procedure Laterality Date  . BACK SURGERY  1988  . COLONOSCOPY    . cortisone injection    . DORSAL COMPARTMENT RELEASE  12/12/2005   right 1st dorsal compartment  . DORSAL COMPARTMENT RELEASE  07/23/2011   Procedure: RELEASE DORSAL COMPARTMENT (DEQUERVAIN);  Surgeon: Cammie Sickle., MD;  [facility]: [facility];  Service: Orthopedics;  Laterality: Left;  1st left dorsal compartment  . SHOULDER ARTHROSCOPY  2/13   rt rcr  . SHOULDER ARTHROSCOPY WITH ROTATOR CUFF REPAIR AND SUBACROMIAL DECOMPRESSION  06/02/2012   Procedure: SHOULDER ARTHROSCOPY WITH ROTATOR CUFF REPAIR AND SUBACROMIAL DECOMPRESSION;  Surgeon: Cammie Sickle., MD;  Location: [facility];  Service: Orthopedics;  Laterality: Left;  LEFT SHOULDER ARTHROSCOPY WITH SUBACROMIAL DECOMPRESSION, DISTAL CLAVICLE RESECTION AND REPAIR ROTATOR CUFF  . TOTAL HIP  ARTHROPLASTY Left 09/21/2015   Procedure: LEFT TOTAL HIP ARTHROPLASTY ANTERIOR APPROACH;  Surgeon: Rod Can, MD;  Location: [facility];  Service: Orthopedics;  Laterality: Left;  . TOTAL HIP ARTHROPLASTY Right 12/25/2015   Procedure: RIGHT TOTAL HIP ARTHROPLASTY ANTERIOR APPROACH;  Surgeon: Rod Can, MD;  Location: [place];  Service: Orthopedics;  Laterality: Right;  Needs RNFA  . TRIGGER FINGER RELEASE  07/23/2011   Procedure: RELEASE TRIGGER FINGER/A-1 PULLEY;  Surgeon: Cammie Sickle., MD;  Location: [place];  Service: Orthopedics;  Laterality: Left;  left thumb  . TRIGGER FINGER RELEASE Left 04/06/2015   Procedure: RELEASE A-1 PULLEY LEFT LONG AND LEFT RING FINGERS;  Surgeon: Leanora Cover, MD;  Location: [place] [facility];  Service: Orthopedics;  Laterality: Left;  . TUBAL LIGATION      Family History: Family History  Problem Relation Age of Onset  . Anesthesia problems Father        very agitated waking up post-op  . Alzheimer's disease Father   . Hyperlipidemia Father   . Heart attack Father   . Heart attack Mother   . Heart disease Mother   . Hypertension Mother   . Melanoma Mother   . Stroke Mother     Social History:   reports that she quit smoking about 27 years ago. Her smoking use included cigarettes. She has a 37.50 pack-year smoking  history. She has never used smokeless tobacco. She reports that she does not drink alcohol or use drugs.  Medications: Medications Prior to Admission  Medication Sig Dispense Refill  . albuterol (PROVENTIL HFA;VENTOLIN HFA) 108 (90 BASE) MCG/ACT inhaler Inhale 2 puffs into the lungs every 4 (four) hours as needed for wheezing or shortness of breath. Reported on 08/10/2015    . atorvastatin (LIPITOR) 10 MG tablet TAKE 1 TABLET BY MOUTH DAILY (Patient taking differently: TAKE 1 TABLET BY MOUTH at bedtime) 30 tablet 0  . cloNIDine (CATAPRES) 0.1 MG tablet Take 0.1 mg by mouth 2 (two) times daily.     .  cyclobenzaprine (FLEXERIL) 10 MG tablet Take 10 mg by mouth at bedtime as needed for muscle spasms.   0  . Fluticasone-Salmeterol (ADVAIR) 250-50 MCG/DOSE AEPB Inhale 1 puff into the lungs 2 (two) times daily.    Marland Kitchen losartan (COZAAR) 50 MG tablet Take 50 mg by mouth daily.    . montelukast (SINGULAIR) 10 MG tablet Take 10 mg by mouth at bedtime.     . traMADol (ULTRAM) 50 MG tablet Take 50 mg by mouth every 6 (six) hours as needed for moderate pain.      No results found for this or any previous visit (from the past 48 hour(s)).  No results found.   A comprehensive review of systems was negative.  Height 5\' 4"  (1.626 m), weight 63.5 kg.  General appearance: alert, cooperative and appears stated age Head: Normocephalic, without obvious abnormality, atraumatic Neck: supple, symmetrical, trachea midline Cardio: regular rate and rhythm Resp: clear to auscultation bilaterally Extremities: Intact sensation and capillary refill all digits.  +epl/fpl/io.  No wounds.  Pulses: 2+ and symmetric Skin: Skin color, texture, turgor normal. No rashes or lesions Neurologic: Grossly normal Incision/Wound: none  Assessment/Plan Right carpal tunnel syndrome.  Non operative and operative treatment options have been discussed with the patient and patient wishes to proceed with operative treatment. Risks, benefits, and alternatives of surgery have been discussed and the patient agrees with the plan of care.   Leanora Cover 05/03/2019, 12:35 PM

## 2019-05-03 NOTE — Anesthesia Preprocedure Evaluation (Signed)
Anesthesia Evaluation  Patient identified by MRN, date of birth, ID band Patient awake    Reviewed: Allergy & Precautions, NPO status , Patient's Chart, lab work & pertinent test results  History of Anesthesia Complications (+) Family history of anesthesia reaction  Airway Mallampati: II  TM Distance: >3 FB Neck ROM: Full    Dental no notable dental hx. (+) Teeth Intact, Dental Advisory Given   Pulmonary asthma , former smoker,    Pulmonary exam normal breath sounds clear to auscultation       Cardiovascular hypertension, Pt. on medications Normal cardiovascular exam Rhythm:Regular Rate:Normal     Neuro/Psych negative neurological ROS  negative psych ROS   GI/Hepatic negative GI ROS, Neg liver ROS, GERD  ,  Endo/Other  negative endocrine ROS  Renal/GU negative Renal ROS  negative genitourinary   Musculoskeletal  (+) Arthritis , Osteoarthritis,    Abdominal   Peds negative pediatric ROS (+)  Hematology negative hematology ROS (+)   Anesthesia Other Findings   Reproductive/Obstetrics negative OB ROS                             Anesthesia Physical  Anesthesia Plan  ASA: II  Anesthesia Plan: MAC and Bier Block and Bier Block-LIDOCAINE ONLY   Post-op Pain Management:    Induction: Intravenous  PONV Risk Score and Plan: 2 and Ondansetron  Airway Management Planned: Nasal Cannula, Simple Face Mask and Mask  Additional Equipment:   Intra-op Plan:   Post-operative Plan:   Informed Consent:     Dental advisory given  Plan Discussed with: CRNA, Surgeon and Anesthesiologist  Anesthesia Plan Comments:         Anesthesia Quick Evaluation

## 2019-05-03 NOTE — Anesthesia Postprocedure Evaluation (Signed)
Anesthesia Post Note  Patient: Jean Hudson  Procedure(s) Performed: RIGHT CARPAL TUNNEL RELEASE (Right Wrist)     Anesthesia Post Evaluation  Last Vitals:  Vitals:   05/03/19 1615 05/03/19 1651  BP: 124/61 131/74  Pulse: (!) 52 (!) 58  Resp: (!) 8 20  Temp:  36.4 C  SpO2: 99% 99%    Last Pain:  Vitals:   05/03/19 1651  TempSrc: Oral  PainSc: 0-No pain                 Isiac Breighner

## 2019-05-05 ENCOUNTER — Encounter (HOSPITAL_BASED_OUTPATIENT_CLINIC_OR_DEPARTMENT_OTHER): Payer: Self-pay | Admitting: Orthopedic Surgery

## 2019-05-11 DIAGNOSIS — Z23 Encounter for immunization: Secondary | ICD-10-CM | POA: Diagnosis not present

## 2019-06-29 DIAGNOSIS — Z6825 Body mass index (BMI) 25.0-25.9, adult: Secondary | ICD-10-CM | POA: Diagnosis not present

## 2019-06-29 DIAGNOSIS — M47817 Spondylosis without myelopathy or radiculopathy, lumbosacral region: Secondary | ICD-10-CM | POA: Diagnosis not present

## 2019-06-29 DIAGNOSIS — M4125 Other idiopathic scoliosis, thoracolumbar region: Secondary | ICD-10-CM | POA: Diagnosis not present

## 2019-08-05 DIAGNOSIS — Z808 Family history of malignant neoplasm of other organs or systems: Secondary | ICD-10-CM | POA: Diagnosis not present

## 2019-08-05 DIAGNOSIS — Z23 Encounter for immunization: Secondary | ICD-10-CM | POA: Diagnosis not present

## 2019-08-05 DIAGNOSIS — D225 Melanocytic nevi of trunk: Secondary | ICD-10-CM | POA: Diagnosis not present

## 2019-08-05 DIAGNOSIS — L821 Other seborrheic keratosis: Secondary | ICD-10-CM | POA: Diagnosis not present

## 2019-08-05 DIAGNOSIS — L905 Scar conditions and fibrosis of skin: Secondary | ICD-10-CM | POA: Diagnosis not present

## 2019-08-05 DIAGNOSIS — Z85828 Personal history of other malignant neoplasm of skin: Secondary | ICD-10-CM | POA: Diagnosis not present

## 2019-08-05 DIAGNOSIS — D2272 Melanocytic nevi of left lower limb, including hip: Secondary | ICD-10-CM | POA: Diagnosis not present

## 2019-08-05 DIAGNOSIS — L578 Other skin changes due to chronic exposure to nonionizing radiation: Secondary | ICD-10-CM | POA: Diagnosis not present

## 2019-08-05 DIAGNOSIS — L570 Actinic keratosis: Secondary | ICD-10-CM | POA: Diagnosis not present

## 2019-09-14 DIAGNOSIS — Z6825 Body mass index (BMI) 25.0-25.9, adult: Secondary | ICD-10-CM | POA: Diagnosis not present

## 2019-09-14 DIAGNOSIS — M545 Low back pain: Secondary | ICD-10-CM | POA: Diagnosis not present

## 2019-09-14 DIAGNOSIS — I10 Essential (primary) hypertension: Secondary | ICD-10-CM | POA: Diagnosis not present

## 2019-09-17 DIAGNOSIS — R194 Change in bowel habit: Secondary | ICD-10-CM | POA: Diagnosis not present

## 2019-09-17 DIAGNOSIS — Z86010 Personal history of colonic polyps: Secondary | ICD-10-CM | POA: Diagnosis not present

## 2019-10-01 DIAGNOSIS — J4520 Mild intermittent asthma, uncomplicated: Secondary | ICD-10-CM | POA: Diagnosis not present

## 2019-10-01 DIAGNOSIS — M810 Age-related osteoporosis without current pathological fracture: Secondary | ICD-10-CM | POA: Diagnosis not present

## 2019-10-01 DIAGNOSIS — I10 Essential (primary) hypertension: Secondary | ICD-10-CM | POA: Diagnosis not present

## 2019-10-01 DIAGNOSIS — E7800 Pure hypercholesterolemia, unspecified: Secondary | ICD-10-CM | POA: Diagnosis not present

## 2019-10-01 DIAGNOSIS — M47816 Spondylosis without myelopathy or radiculopathy, lumbar region: Secondary | ICD-10-CM | POA: Diagnosis not present

## 2019-10-21 DIAGNOSIS — J4520 Mild intermittent asthma, uncomplicated: Secondary | ICD-10-CM | POA: Diagnosis not present

## 2019-10-21 DIAGNOSIS — M47816 Spondylosis without myelopathy or radiculopathy, lumbar region: Secondary | ICD-10-CM | POA: Diagnosis not present

## 2019-10-21 DIAGNOSIS — M810 Age-related osteoporosis without current pathological fracture: Secondary | ICD-10-CM | POA: Diagnosis not present

## 2019-10-21 DIAGNOSIS — E7800 Pure hypercholesterolemia, unspecified: Secondary | ICD-10-CM | POA: Diagnosis not present

## 2019-10-21 DIAGNOSIS — I10 Essential (primary) hypertension: Secondary | ICD-10-CM | POA: Diagnosis not present

## 2019-10-22 DIAGNOSIS — L723 Sebaceous cyst: Secondary | ICD-10-CM | POA: Diagnosis not present

## 2019-10-22 DIAGNOSIS — L570 Actinic keratosis: Secondary | ICD-10-CM | POA: Diagnosis not present

## 2019-10-22 DIAGNOSIS — I781 Nevus, non-neoplastic: Secondary | ICD-10-CM | POA: Diagnosis not present

## 2019-11-24 DIAGNOSIS — M47816 Spondylosis without myelopathy or radiculopathy, lumbar region: Secondary | ICD-10-CM | POA: Diagnosis not present

## 2019-11-24 DIAGNOSIS — E7800 Pure hypercholesterolemia, unspecified: Secondary | ICD-10-CM | POA: Diagnosis not present

## 2019-11-24 DIAGNOSIS — J4520 Mild intermittent asthma, uncomplicated: Secondary | ICD-10-CM | POA: Diagnosis not present

## 2019-11-24 DIAGNOSIS — M810 Age-related osteoporosis without current pathological fracture: Secondary | ICD-10-CM | POA: Diagnosis not present

## 2019-11-24 DIAGNOSIS — I10 Essential (primary) hypertension: Secondary | ICD-10-CM | POA: Diagnosis not present

## 2019-12-14 DIAGNOSIS — N959 Unspecified menopausal and perimenopausal disorder: Secondary | ICD-10-CM | POA: Diagnosis not present

## 2019-12-14 DIAGNOSIS — Z6826 Body mass index (BMI) 26.0-26.9, adult: Secondary | ICD-10-CM | POA: Diagnosis not present

## 2019-12-14 DIAGNOSIS — Z124 Encounter for screening for malignant neoplasm of cervix: Secondary | ICD-10-CM | POA: Diagnosis not present

## 2019-12-21 DIAGNOSIS — M545 Low back pain: Secondary | ICD-10-CM | POA: Diagnosis not present

## 2019-12-29 DIAGNOSIS — Z1211 Encounter for screening for malignant neoplasm of colon: Secondary | ICD-10-CM | POA: Diagnosis not present

## 2019-12-29 DIAGNOSIS — D123 Benign neoplasm of transverse colon: Secondary | ICD-10-CM | POA: Diagnosis not present

## 2019-12-29 DIAGNOSIS — K640 First degree hemorrhoids: Secondary | ICD-10-CM | POA: Diagnosis not present

## 2019-12-29 DIAGNOSIS — Z86010 Personal history of colonic polyps: Secondary | ICD-10-CM | POA: Diagnosis not present

## 2019-12-30 DIAGNOSIS — I10 Essential (primary) hypertension: Secondary | ICD-10-CM | POA: Diagnosis not present

## 2019-12-30 DIAGNOSIS — M810 Age-related osteoporosis without current pathological fracture: Secondary | ICD-10-CM | POA: Diagnosis not present

## 2019-12-30 DIAGNOSIS — J309 Allergic rhinitis, unspecified: Secondary | ICD-10-CM | POA: Diagnosis not present

## 2019-12-30 DIAGNOSIS — Z86010 Personal history of colonic polyps: Secondary | ICD-10-CM | POA: Diagnosis not present

## 2019-12-30 DIAGNOSIS — M47816 Spondylosis without myelopathy or radiculopathy, lumbar region: Secondary | ICD-10-CM | POA: Diagnosis not present

## 2019-12-30 DIAGNOSIS — J4520 Mild intermittent asthma, uncomplicated: Secondary | ICD-10-CM | POA: Diagnosis not present

## 2019-12-30 DIAGNOSIS — E7800 Pure hypercholesterolemia, unspecified: Secondary | ICD-10-CM | POA: Diagnosis not present

## 2020-02-01 DIAGNOSIS — E7800 Pure hypercholesterolemia, unspecified: Secondary | ICD-10-CM | POA: Diagnosis not present

## 2020-02-01 DIAGNOSIS — M47816 Spondylosis without myelopathy or radiculopathy, lumbar region: Secondary | ICD-10-CM | POA: Diagnosis not present

## 2020-02-01 DIAGNOSIS — M810 Age-related osteoporosis without current pathological fracture: Secondary | ICD-10-CM | POA: Diagnosis not present

## 2020-02-01 DIAGNOSIS — I10 Essential (primary) hypertension: Secondary | ICD-10-CM | POA: Diagnosis not present

## 2020-02-01 DIAGNOSIS — J4520 Mild intermittent asthma, uncomplicated: Secondary | ICD-10-CM | POA: Diagnosis not present

## 2020-03-15 DIAGNOSIS — M4125 Other idiopathic scoliosis, thoracolumbar region: Secondary | ICD-10-CM | POA: Diagnosis not present

## 2020-03-15 DIAGNOSIS — M545 Low back pain: Secondary | ICD-10-CM | POA: Diagnosis not present

## 2020-04-04 DIAGNOSIS — M720 Palmar fascial fibromatosis [Dupuytren]: Secondary | ICD-10-CM | POA: Diagnosis not present

## 2020-04-10 DIAGNOSIS — L570 Actinic keratosis: Secondary | ICD-10-CM | POA: Diagnosis not present

## 2020-04-13 DIAGNOSIS — M47816 Spondylosis without myelopathy or radiculopathy, lumbar region: Secondary | ICD-10-CM | POA: Diagnosis not present

## 2020-04-13 DIAGNOSIS — J4520 Mild intermittent asthma, uncomplicated: Secondary | ICD-10-CM | POA: Diagnosis not present

## 2020-04-13 DIAGNOSIS — E7800 Pure hypercholesterolemia, unspecified: Secondary | ICD-10-CM | POA: Diagnosis not present

## 2020-04-13 DIAGNOSIS — M810 Age-related osteoporosis without current pathological fracture: Secondary | ICD-10-CM | POA: Diagnosis not present

## 2020-04-13 DIAGNOSIS — I10 Essential (primary) hypertension: Secondary | ICD-10-CM | POA: Diagnosis not present

## 2020-04-13 DIAGNOSIS — K219 Gastro-esophageal reflux disease without esophagitis: Secondary | ICD-10-CM | POA: Diagnosis not present

## 2020-04-20 DIAGNOSIS — H43811 Vitreous degeneration, right eye: Secondary | ICD-10-CM | POA: Diagnosis not present

## 2020-04-20 DIAGNOSIS — H52203 Unspecified astigmatism, bilateral: Secondary | ICD-10-CM | POA: Diagnosis not present

## 2020-04-20 DIAGNOSIS — H2513 Age-related nuclear cataract, bilateral: Secondary | ICD-10-CM | POA: Diagnosis not present

## 2020-04-20 DIAGNOSIS — H5213 Myopia, bilateral: Secondary | ICD-10-CM | POA: Diagnosis not present

## 2020-06-02 DIAGNOSIS — Z23 Encounter for immunization: Secondary | ICD-10-CM | POA: Diagnosis not present

## 2020-06-05 DIAGNOSIS — Z872 Personal history of diseases of the skin and subcutaneous tissue: Secondary | ICD-10-CM | POA: Diagnosis not present

## 2020-06-05 DIAGNOSIS — L810 Postinflammatory hyperpigmentation: Secondary | ICD-10-CM | POA: Diagnosis not present

## 2020-06-13 DIAGNOSIS — M47816 Spondylosis without myelopathy or radiculopathy, lumbar region: Secondary | ICD-10-CM | POA: Diagnosis not present

## 2020-06-13 DIAGNOSIS — K219 Gastro-esophageal reflux disease without esophagitis: Secondary | ICD-10-CM | POA: Diagnosis not present

## 2020-06-13 DIAGNOSIS — J4520 Mild intermittent asthma, uncomplicated: Secondary | ICD-10-CM | POA: Diagnosis not present

## 2020-06-13 DIAGNOSIS — I10 Essential (primary) hypertension: Secondary | ICD-10-CM | POA: Diagnosis not present

## 2020-06-13 DIAGNOSIS — M810 Age-related osteoporosis without current pathological fracture: Secondary | ICD-10-CM | POA: Diagnosis not present

## 2020-06-13 DIAGNOSIS — E7800 Pure hypercholesterolemia, unspecified: Secondary | ICD-10-CM | POA: Diagnosis not present

## 2020-06-14 DIAGNOSIS — M47817 Spondylosis without myelopathy or radiculopathy, lumbosacral region: Secondary | ICD-10-CM | POA: Diagnosis not present

## 2020-07-11 DIAGNOSIS — Z1231 Encounter for screening mammogram for malignant neoplasm of breast: Secondary | ICD-10-CM | POA: Diagnosis not present

## 2020-07-25 DIAGNOSIS — R928 Other abnormal and inconclusive findings on diagnostic imaging of breast: Secondary | ICD-10-CM | POA: Diagnosis not present

## 2020-07-31 ENCOUNTER — Ambulatory Visit: Payer: Medicare Other

## 2020-08-08 DIAGNOSIS — Z808 Family history of malignant neoplasm of other organs or systems: Secondary | ICD-10-CM | POA: Diagnosis not present

## 2020-08-08 DIAGNOSIS — L570 Actinic keratosis: Secondary | ICD-10-CM | POA: Diagnosis not present

## 2020-08-08 DIAGNOSIS — L821 Other seborrheic keratosis: Secondary | ICD-10-CM | POA: Diagnosis not present

## 2020-08-08 DIAGNOSIS — L578 Other skin changes due to chronic exposure to nonionizing radiation: Secondary | ICD-10-CM | POA: Diagnosis not present

## 2020-08-08 DIAGNOSIS — Z85828 Personal history of other malignant neoplasm of skin: Secondary | ICD-10-CM | POA: Diagnosis not present

## 2020-08-08 DIAGNOSIS — D225 Melanocytic nevi of trunk: Secondary | ICD-10-CM | POA: Diagnosis not present

## 2020-08-09 DIAGNOSIS — Z23 Encounter for immunization: Secondary | ICD-10-CM | POA: Diagnosis not present

## 2020-08-30 DIAGNOSIS — E7800 Pure hypercholesterolemia, unspecified: Secondary | ICD-10-CM | POA: Diagnosis not present

## 2020-08-30 DIAGNOSIS — Z79899 Other long term (current) drug therapy: Secondary | ICD-10-CM | POA: Diagnosis not present

## 2020-09-01 DIAGNOSIS — E7800 Pure hypercholesterolemia, unspecified: Secondary | ICD-10-CM | POA: Diagnosis not present

## 2020-09-01 DIAGNOSIS — E785 Hyperlipidemia, unspecified: Secondary | ICD-10-CM | POA: Diagnosis not present

## 2020-09-01 DIAGNOSIS — I10 Essential (primary) hypertension: Secondary | ICD-10-CM | POA: Diagnosis not present

## 2020-09-01 DIAGNOSIS — M47816 Spondylosis without myelopathy or radiculopathy, lumbar region: Secondary | ICD-10-CM | POA: Diagnosis not present

## 2020-09-01 DIAGNOSIS — K219 Gastro-esophageal reflux disease without esophagitis: Secondary | ICD-10-CM | POA: Diagnosis not present

## 2020-09-01 DIAGNOSIS — J4520 Mild intermittent asthma, uncomplicated: Secondary | ICD-10-CM | POA: Diagnosis not present

## 2020-09-01 DIAGNOSIS — M810 Age-related osteoporosis without current pathological fracture: Secondary | ICD-10-CM | POA: Diagnosis not present

## 2020-09-07 DIAGNOSIS — M47817 Spondylosis without myelopathy or radiculopathy, lumbosacral region: Secondary | ICD-10-CM | POA: Diagnosis not present

## 2020-09-07 DIAGNOSIS — Z6825 Body mass index (BMI) 25.0-25.9, adult: Secondary | ICD-10-CM | POA: Diagnosis not present

## 2020-09-07 DIAGNOSIS — I10 Essential (primary) hypertension: Secondary | ICD-10-CM | POA: Diagnosis not present

## 2020-09-07 DIAGNOSIS — M25511 Pain in right shoulder: Secondary | ICD-10-CM | POA: Diagnosis not present

## 2020-09-13 DIAGNOSIS — N760 Acute vaginitis: Secondary | ICD-10-CM | POA: Diagnosis not present

## 2020-09-13 DIAGNOSIS — R102 Pelvic and perineal pain: Secondary | ICD-10-CM | POA: Diagnosis not present

## 2020-09-14 DIAGNOSIS — M8500 Fibrous dysplasia (monostotic), unspecified site: Secondary | ICD-10-CM | POA: Diagnosis not present

## 2020-09-14 DIAGNOSIS — R102 Pelvic and perineal pain: Secondary | ICD-10-CM | POA: Diagnosis not present

## 2020-09-26 DIAGNOSIS — M47816 Spondylosis without myelopathy or radiculopathy, lumbar region: Secondary | ICD-10-CM | POA: Diagnosis not present

## 2020-09-26 DIAGNOSIS — J4520 Mild intermittent asthma, uncomplicated: Secondary | ICD-10-CM | POA: Diagnosis not present

## 2020-09-26 DIAGNOSIS — I10 Essential (primary) hypertension: Secondary | ICD-10-CM | POA: Diagnosis not present

## 2020-09-26 DIAGNOSIS — E785 Hyperlipidemia, unspecified: Secondary | ICD-10-CM | POA: Diagnosis not present

## 2020-09-26 DIAGNOSIS — M810 Age-related osteoporosis without current pathological fracture: Secondary | ICD-10-CM | POA: Diagnosis not present

## 2020-09-26 DIAGNOSIS — K219 Gastro-esophageal reflux disease without esophagitis: Secondary | ICD-10-CM | POA: Diagnosis not present

## 2020-09-26 DIAGNOSIS — E7800 Pure hypercholesterolemia, unspecified: Secondary | ICD-10-CM | POA: Diagnosis not present

## 2020-11-10 DIAGNOSIS — I10 Essential (primary) hypertension: Secondary | ICD-10-CM | POA: Diagnosis not present

## 2020-11-10 DIAGNOSIS — M810 Age-related osteoporosis without current pathological fracture: Secondary | ICD-10-CM | POA: Diagnosis not present

## 2020-11-10 DIAGNOSIS — K219 Gastro-esophageal reflux disease without esophagitis: Secondary | ICD-10-CM | POA: Diagnosis not present

## 2020-11-10 DIAGNOSIS — E785 Hyperlipidemia, unspecified: Secondary | ICD-10-CM | POA: Diagnosis not present

## 2020-11-10 DIAGNOSIS — E7800 Pure hypercholesterolemia, unspecified: Secondary | ICD-10-CM | POA: Diagnosis not present

## 2020-11-10 DIAGNOSIS — J4520 Mild intermittent asthma, uncomplicated: Secondary | ICD-10-CM | POA: Diagnosis not present

## 2020-11-10 DIAGNOSIS — M47816 Spondylosis without myelopathy or radiculopathy, lumbar region: Secondary | ICD-10-CM | POA: Diagnosis not present

## 2020-12-04 DIAGNOSIS — M47817 Spondylosis without myelopathy or radiculopathy, lumbosacral region: Secondary | ICD-10-CM | POA: Diagnosis not present

## 2020-12-04 DIAGNOSIS — M25511 Pain in right shoulder: Secondary | ICD-10-CM | POA: Diagnosis not present

## 2020-12-04 DIAGNOSIS — M4125 Other idiopathic scoliosis, thoracolumbar region: Secondary | ICD-10-CM | POA: Diagnosis not present

## 2021-01-23 DIAGNOSIS — M47816 Spondylosis without myelopathy or radiculopathy, lumbar region: Secondary | ICD-10-CM | POA: Diagnosis not present

## 2021-01-23 DIAGNOSIS — M810 Age-related osteoporosis without current pathological fracture: Secondary | ICD-10-CM | POA: Diagnosis not present

## 2021-01-23 DIAGNOSIS — K219 Gastro-esophageal reflux disease without esophagitis: Secondary | ICD-10-CM | POA: Diagnosis not present

## 2021-01-23 DIAGNOSIS — I10 Essential (primary) hypertension: Secondary | ICD-10-CM | POA: Diagnosis not present

## 2021-01-23 DIAGNOSIS — E7800 Pure hypercholesterolemia, unspecified: Secondary | ICD-10-CM | POA: Diagnosis not present

## 2021-01-23 DIAGNOSIS — J4520 Mild intermittent asthma, uncomplicated: Secondary | ICD-10-CM | POA: Diagnosis not present

## 2021-01-23 DIAGNOSIS — E785 Hyperlipidemia, unspecified: Secondary | ICD-10-CM | POA: Diagnosis not present

## 2021-01-26 DIAGNOSIS — Z20822 Contact with and (suspected) exposure to covid-19: Secondary | ICD-10-CM | POA: Diagnosis not present

## 2021-02-01 DIAGNOSIS — L570 Actinic keratosis: Secondary | ICD-10-CM | POA: Diagnosis not present

## 2021-02-01 DIAGNOSIS — B078 Other viral warts: Secondary | ICD-10-CM | POA: Diagnosis not present

## 2021-02-01 DIAGNOSIS — L578 Other skin changes due to chronic exposure to nonionizing radiation: Secondary | ICD-10-CM | POA: Diagnosis not present

## 2021-02-01 DIAGNOSIS — D485 Neoplasm of uncertain behavior of skin: Secondary | ICD-10-CM | POA: Diagnosis not present

## 2021-02-13 DIAGNOSIS — K219 Gastro-esophageal reflux disease without esophagitis: Secondary | ICD-10-CM | POA: Diagnosis not present

## 2021-02-13 DIAGNOSIS — I10 Essential (primary) hypertension: Secondary | ICD-10-CM | POA: Diagnosis not present

## 2021-02-13 DIAGNOSIS — E785 Hyperlipidemia, unspecified: Secondary | ICD-10-CM | POA: Diagnosis not present

## 2021-02-13 DIAGNOSIS — J4520 Mild intermittent asthma, uncomplicated: Secondary | ICD-10-CM | POA: Diagnosis not present

## 2021-02-13 DIAGNOSIS — M810 Age-related osteoporosis without current pathological fracture: Secondary | ICD-10-CM | POA: Diagnosis not present

## 2021-02-13 DIAGNOSIS — M47816 Spondylosis without myelopathy or radiculopathy, lumbar region: Secondary | ICD-10-CM | POA: Diagnosis not present

## 2021-03-01 DIAGNOSIS — E785 Hyperlipidemia, unspecified: Secondary | ICD-10-CM | POA: Diagnosis not present

## 2021-03-01 DIAGNOSIS — M810 Age-related osteoporosis without current pathological fracture: Secondary | ICD-10-CM | POA: Diagnosis not present

## 2021-03-01 DIAGNOSIS — J4520 Mild intermittent asthma, uncomplicated: Secondary | ICD-10-CM | POA: Diagnosis not present

## 2021-03-01 DIAGNOSIS — I10 Essential (primary) hypertension: Secondary | ICD-10-CM | POA: Diagnosis not present

## 2021-03-01 DIAGNOSIS — E7800 Pure hypercholesterolemia, unspecified: Secondary | ICD-10-CM | POA: Diagnosis not present

## 2021-03-01 DIAGNOSIS — M47816 Spondylosis without myelopathy or radiculopathy, lumbar region: Secondary | ICD-10-CM | POA: Diagnosis not present

## 2021-03-01 DIAGNOSIS — K219 Gastro-esophageal reflux disease without esophagitis: Secondary | ICD-10-CM | POA: Diagnosis not present

## 2021-03-05 DIAGNOSIS — M47817 Spondylosis without myelopathy or radiculopathy, lumbosacral region: Secondary | ICD-10-CM | POA: Diagnosis not present

## 2021-03-05 DIAGNOSIS — M4125 Other idiopathic scoliosis, thoracolumbar region: Secondary | ICD-10-CM | POA: Diagnosis not present

## 2021-03-05 DIAGNOSIS — M25511 Pain in right shoulder: Secondary | ICD-10-CM | POA: Diagnosis not present

## 2021-03-06 DIAGNOSIS — R0981 Nasal congestion: Secondary | ICD-10-CM | POA: Diagnosis not present

## 2021-03-06 DIAGNOSIS — J4520 Mild intermittent asthma, uncomplicated: Secondary | ICD-10-CM | POA: Diagnosis not present

## 2021-03-20 DIAGNOSIS — J309 Allergic rhinitis, unspecified: Secondary | ICD-10-CM | POA: Diagnosis not present

## 2021-03-28 DIAGNOSIS — E785 Hyperlipidemia, unspecified: Secondary | ICD-10-CM | POA: Diagnosis not present

## 2021-03-28 DIAGNOSIS — M47816 Spondylosis without myelopathy or radiculopathy, lumbar region: Secondary | ICD-10-CM | POA: Diagnosis not present

## 2021-03-28 DIAGNOSIS — I10 Essential (primary) hypertension: Secondary | ICD-10-CM | POA: Diagnosis not present

## 2021-03-28 DIAGNOSIS — E7800 Pure hypercholesterolemia, unspecified: Secondary | ICD-10-CM | POA: Diagnosis not present

## 2021-03-28 DIAGNOSIS — K219 Gastro-esophageal reflux disease without esophagitis: Secondary | ICD-10-CM | POA: Diagnosis not present

## 2021-03-28 DIAGNOSIS — M810 Age-related osteoporosis without current pathological fracture: Secondary | ICD-10-CM | POA: Diagnosis not present

## 2021-03-28 DIAGNOSIS — J4520 Mild intermittent asthma, uncomplicated: Secondary | ICD-10-CM | POA: Diagnosis not present

## 2021-04-24 DIAGNOSIS — H5203 Hypermetropia, bilateral: Secondary | ICD-10-CM | POA: Diagnosis not present

## 2021-04-24 DIAGNOSIS — H2513 Age-related nuclear cataract, bilateral: Secondary | ICD-10-CM | POA: Diagnosis not present

## 2021-04-24 DIAGNOSIS — H43811 Vitreous degeneration, right eye: Secondary | ICD-10-CM | POA: Diagnosis not present

## 2021-04-30 DIAGNOSIS — Z20822 Contact with and (suspected) exposure to covid-19: Secondary | ICD-10-CM | POA: Diagnosis not present

## 2021-05-08 DIAGNOSIS — J3089 Other allergic rhinitis: Secondary | ICD-10-CM | POA: Diagnosis not present

## 2021-05-08 DIAGNOSIS — R439 Unspecified disturbances of smell and taste: Secondary | ICD-10-CM | POA: Diagnosis not present

## 2021-05-31 DIAGNOSIS — M47817 Spondylosis without myelopathy or radiculopathy, lumbosacral region: Secondary | ICD-10-CM | POA: Diagnosis not present

## 2021-05-31 DIAGNOSIS — M25511 Pain in right shoulder: Secondary | ICD-10-CM | POA: Diagnosis not present

## 2021-06-03 DIAGNOSIS — Z23 Encounter for immunization: Secondary | ICD-10-CM | POA: Diagnosis not present

## 2021-07-02 DIAGNOSIS — E785 Hyperlipidemia, unspecified: Secondary | ICD-10-CM | POA: Diagnosis not present

## 2021-07-13 DIAGNOSIS — J3089 Other allergic rhinitis: Secondary | ICD-10-CM | POA: Diagnosis not present

## 2021-07-13 DIAGNOSIS — T781XXD Other adverse food reactions, not elsewhere classified, subsequent encounter: Secondary | ICD-10-CM | POA: Diagnosis not present

## 2021-07-13 DIAGNOSIS — H1045 Other chronic allergic conjunctivitis: Secondary | ICD-10-CM | POA: Diagnosis not present

## 2021-07-13 DIAGNOSIS — J4530 Mild persistent asthma, uncomplicated: Secondary | ICD-10-CM | POA: Diagnosis not present

## 2021-07-17 DIAGNOSIS — Z1231 Encounter for screening mammogram for malignant neoplasm of breast: Secondary | ICD-10-CM | POA: Diagnosis not present

## 2021-08-14 DIAGNOSIS — L578 Other skin changes due to chronic exposure to nonionizing radiation: Secondary | ICD-10-CM | POA: Diagnosis not present

## 2021-08-14 DIAGNOSIS — D225 Melanocytic nevi of trunk: Secondary | ICD-10-CM | POA: Diagnosis not present

## 2021-08-14 DIAGNOSIS — Z808 Family history of malignant neoplasm of other organs or systems: Secondary | ICD-10-CM | POA: Diagnosis not present

## 2021-08-14 DIAGNOSIS — L821 Other seborrheic keratosis: Secondary | ICD-10-CM | POA: Diagnosis not present

## 2021-08-14 DIAGNOSIS — D485 Neoplasm of uncertain behavior of skin: Secondary | ICD-10-CM | POA: Diagnosis not present

## 2021-08-14 DIAGNOSIS — L570 Actinic keratosis: Secondary | ICD-10-CM | POA: Diagnosis not present

## 2021-08-14 DIAGNOSIS — Z85828 Personal history of other malignant neoplasm of skin: Secondary | ICD-10-CM | POA: Diagnosis not present

## 2021-08-14 DIAGNOSIS — D0461 Carcinoma in situ of skin of right upper limb, including shoulder: Secondary | ICD-10-CM | POA: Diagnosis not present

## 2021-08-23 DIAGNOSIS — M47817 Spondylosis without myelopathy or radiculopathy, lumbosacral region: Secondary | ICD-10-CM | POA: Diagnosis not present

## 2021-08-23 DIAGNOSIS — M25511 Pain in right shoulder: Secondary | ICD-10-CM | POA: Diagnosis not present

## 2021-09-06 DIAGNOSIS — M79641 Pain in right hand: Secondary | ICD-10-CM | POA: Diagnosis not present

## 2021-09-06 DIAGNOSIS — S60221A Contusion of right hand, initial encounter: Secondary | ICD-10-CM | POA: Diagnosis not present

## 2021-09-25 DIAGNOSIS — C44622 Squamous cell carcinoma of skin of right upper limb, including shoulder: Secondary | ICD-10-CM | POA: Diagnosis not present

## 2021-10-23 DIAGNOSIS — Z20822 Contact with and (suspected) exposure to covid-19: Secondary | ICD-10-CM | POA: Diagnosis not present

## 2021-10-23 DIAGNOSIS — J300 Vasomotor rhinitis: Secondary | ICD-10-CM | POA: Diagnosis not present

## 2021-10-23 DIAGNOSIS — J4530 Mild persistent asthma, uncomplicated: Secondary | ICD-10-CM | POA: Diagnosis not present

## 2021-10-23 DIAGNOSIS — H1045 Other chronic allergic conjunctivitis: Secondary | ICD-10-CM | POA: Diagnosis not present

## 2022-01-24 DIAGNOSIS — R194 Change in bowel habit: Secondary | ICD-10-CM | POA: Diagnosis not present

## 2022-01-24 DIAGNOSIS — K5909 Other constipation: Secondary | ICD-10-CM | POA: Diagnosis not present

## 2022-02-20 DIAGNOSIS — M47817 Spondylosis without myelopathy or radiculopathy, lumbosacral region: Secondary | ICD-10-CM | POA: Diagnosis not present

## 2022-04-11 DIAGNOSIS — Z808 Family history of malignant neoplasm of other organs or systems: Secondary | ICD-10-CM | POA: Diagnosis not present

## 2022-04-11 DIAGNOSIS — L570 Actinic keratosis: Secondary | ICD-10-CM | POA: Diagnosis not present

## 2022-04-11 DIAGNOSIS — D225 Melanocytic nevi of trunk: Secondary | ICD-10-CM | POA: Diagnosis not present

## 2022-04-11 DIAGNOSIS — D485 Neoplasm of uncertain behavior of skin: Secondary | ICD-10-CM | POA: Diagnosis not present

## 2022-04-11 DIAGNOSIS — A630 Anogenital (venereal) warts: Secondary | ICD-10-CM | POA: Diagnosis not present

## 2022-04-11 DIAGNOSIS — Z85828 Personal history of other malignant neoplasm of skin: Secondary | ICD-10-CM | POA: Diagnosis not present

## 2022-04-11 DIAGNOSIS — L578 Other skin changes due to chronic exposure to nonionizing radiation: Secondary | ICD-10-CM | POA: Diagnosis not present

## 2022-04-11 DIAGNOSIS — L821 Other seborrheic keratosis: Secondary | ICD-10-CM | POA: Diagnosis not present

## 2022-04-11 DIAGNOSIS — L859 Epidermal thickening, unspecified: Secondary | ICD-10-CM | POA: Diagnosis not present

## 2022-04-30 DIAGNOSIS — H52203 Unspecified astigmatism, bilateral: Secondary | ICD-10-CM | POA: Diagnosis not present

## 2022-04-30 DIAGNOSIS — H2513 Age-related nuclear cataract, bilateral: Secondary | ICD-10-CM | POA: Diagnosis not present

## 2022-04-30 DIAGNOSIS — H47323 Drusen of optic disc, bilateral: Secondary | ICD-10-CM | POA: Diagnosis not present

## 2022-05-12 DIAGNOSIS — Z23 Encounter for immunization: Secondary | ICD-10-CM | POA: Diagnosis not present

## 2022-05-13 DIAGNOSIS — J4530 Mild persistent asthma, uncomplicated: Secondary | ICD-10-CM | POA: Diagnosis not present

## 2022-05-13 DIAGNOSIS — J300 Vasomotor rhinitis: Secondary | ICD-10-CM | POA: Diagnosis not present

## 2022-05-13 DIAGNOSIS — H1045 Other chronic allergic conjunctivitis: Secondary | ICD-10-CM | POA: Diagnosis not present

## 2022-05-14 DIAGNOSIS — I10 Essential (primary) hypertension: Secondary | ICD-10-CM | POA: Diagnosis not present

## 2022-05-14 DIAGNOSIS — Z79899 Other long term (current) drug therapy: Secondary | ICD-10-CM | POA: Diagnosis not present

## 2022-05-14 DIAGNOSIS — R911 Solitary pulmonary nodule: Secondary | ICD-10-CM | POA: Diagnosis not present

## 2022-05-14 DIAGNOSIS — Z6825 Body mass index (BMI) 25.0-25.9, adult: Secondary | ICD-10-CM | POA: Diagnosis not present

## 2022-05-14 DIAGNOSIS — M47816 Spondylosis without myelopathy or radiculopathy, lumbar region: Secondary | ICD-10-CM | POA: Diagnosis not present

## 2022-05-14 DIAGNOSIS — K219 Gastro-esophageal reflux disease without esophagitis: Secondary | ICD-10-CM | POA: Diagnosis not present

## 2022-05-14 DIAGNOSIS — E7800 Pure hypercholesterolemia, unspecified: Secondary | ICD-10-CM | POA: Diagnosis not present

## 2022-05-14 DIAGNOSIS — J4520 Mild intermittent asthma, uncomplicated: Secondary | ICD-10-CM | POA: Diagnosis not present

## 2022-05-14 DIAGNOSIS — D126 Benign neoplasm of colon, unspecified: Secondary | ICD-10-CM | POA: Diagnosis not present

## 2022-05-14 DIAGNOSIS — M810 Age-related osteoporosis without current pathological fracture: Secondary | ICD-10-CM | POA: Diagnosis not present

## 2022-05-15 ENCOUNTER — Other Ambulatory Visit: Payer: Self-pay | Admitting: Family Medicine

## 2022-05-15 DIAGNOSIS — R911 Solitary pulmonary nodule: Secondary | ICD-10-CM

## 2022-05-21 ENCOUNTER — Ambulatory Visit
Admission: RE | Admit: 2022-05-21 | Discharge: 2022-05-21 | Disposition: A | Discharge status: Home / Self Care | Payer: Medicare Other | Source: Ambulatory Visit | Attending: Family Medicine | Admitting: Family Medicine

## 2022-05-21 DIAGNOSIS — J439 Emphysema, unspecified: Secondary | ICD-10-CM | POA: Diagnosis not present

## 2022-05-21 DIAGNOSIS — R911 Solitary pulmonary nodule: Secondary | ICD-10-CM

## 2022-05-21 DIAGNOSIS — I700 Atherosclerosis of aorta: Secondary | ICD-10-CM | POA: Diagnosis not present

## 2022-05-21 DIAGNOSIS — J929 Pleural plaque without asbestos: Secondary | ICD-10-CM | POA: Diagnosis not present

## 2022-05-21 DIAGNOSIS — J948 Other specified pleural conditions: Secondary | ICD-10-CM | POA: Diagnosis not present

## 2022-05-21 MED ORDER — IOPAMIDOL (ISOVUE-300) INJECTION 61%
75.0000 mL | Freq: Once | INTRAVENOUS | Status: AC | PRN
  Administered 2022-05-21: 75 mL via INTRAVENOUS

## 2022-05-23 ENCOUNTER — Encounter: Payer: Self-pay | Admitting: Family Medicine

## 2022-06-02 DIAGNOSIS — M25512 Pain in left shoulder: Secondary | ICD-10-CM | POA: Diagnosis not present

## 2022-06-02 DIAGNOSIS — M25561 Pain in right knee: Secondary | ICD-10-CM | POA: Diagnosis not present

## 2022-06-03 ENCOUNTER — Other Ambulatory Visit: Payer: Self-pay | Admitting: Orthopedic Surgery

## 2022-06-03 DIAGNOSIS — M25512 Pain in left shoulder: Secondary | ICD-10-CM | POA: Diagnosis not present

## 2022-06-03 DIAGNOSIS — S82001A Unspecified fracture of right patella, initial encounter for closed fracture: Secondary | ICD-10-CM | POA: Diagnosis not present

## 2022-06-03 DIAGNOSIS — S82091A Other fracture of right patella, initial encounter for closed fracture: Secondary | ICD-10-CM

## 2022-06-05 ENCOUNTER — Ambulatory Visit
Admission: RE | Admit: 2022-06-05 | Discharge: 2022-06-05 | Disposition: A | Discharge status: Home / Self Care | Payer: Medicare Other | Source: Ambulatory Visit | Attending: Orthopedic Surgery | Admitting: Orthopedic Surgery

## 2022-06-05 DIAGNOSIS — S82041A Displaced comminuted fracture of right patella, initial encounter for closed fracture: Secondary | ICD-10-CM | POA: Diagnosis not present

## 2022-06-05 DIAGNOSIS — S82091A Other fracture of right patella, initial encounter for closed fracture: Secondary | ICD-10-CM

## 2022-06-13 DIAGNOSIS — G8918 Other acute postprocedural pain: Secondary | ICD-10-CM | POA: Diagnosis not present

## 2022-06-13 DIAGNOSIS — S82001A Unspecified fracture of right patella, initial encounter for closed fracture: Secondary | ICD-10-CM | POA: Diagnosis not present

## 2022-06-13 DIAGNOSIS — S82041A Displaced comminuted fracture of right patella, initial encounter for closed fracture: Secondary | ICD-10-CM | POA: Diagnosis not present

## 2022-06-13 DIAGNOSIS — Y999 Unspecified external cause status: Secondary | ICD-10-CM | POA: Diagnosis not present

## 2022-06-13 DIAGNOSIS — X58XXXA Exposure to other specified factors, initial encounter: Secondary | ICD-10-CM | POA: Diagnosis not present

## 2022-06-26 DIAGNOSIS — S82001D Unspecified fracture of right patella, subsequent encounter for closed fracture with routine healing: Secondary | ICD-10-CM | POA: Diagnosis not present

## 2022-07-04 ENCOUNTER — Other Ambulatory Visit: Payer: Medicare Other

## 2022-07-19 DIAGNOSIS — S82001D Unspecified fracture of right patella, subsequent encounter for closed fracture with routine healing: Secondary | ICD-10-CM | POA: Diagnosis not present

## 2022-07-24 DIAGNOSIS — M6281 Muscle weakness (generalized): Secondary | ICD-10-CM | POA: Diagnosis not present

## 2022-07-24 DIAGNOSIS — S82001D Unspecified fracture of right patella, subsequent encounter for closed fracture with routine healing: Secondary | ICD-10-CM | POA: Diagnosis not present

## 2022-07-24 DIAGNOSIS — R262 Difficulty in walking, not elsewhere classified: Secondary | ICD-10-CM | POA: Diagnosis not present

## 2022-07-24 DIAGNOSIS — M25661 Stiffness of right knee, not elsewhere classified: Secondary | ICD-10-CM | POA: Diagnosis not present

## 2022-07-30 DIAGNOSIS — M6281 Muscle weakness (generalized): Secondary | ICD-10-CM | POA: Diagnosis not present

## 2022-07-30 DIAGNOSIS — R262 Difficulty in walking, not elsewhere classified: Secondary | ICD-10-CM | POA: Diagnosis not present

## 2022-07-30 DIAGNOSIS — M25661 Stiffness of right knee, not elsewhere classified: Secondary | ICD-10-CM | POA: Diagnosis not present

## 2022-07-30 DIAGNOSIS — S82001D Unspecified fracture of right patella, subsequent encounter for closed fracture with routine healing: Secondary | ICD-10-CM | POA: Diagnosis not present

## 2022-08-02 DIAGNOSIS — S82001D Unspecified fracture of right patella, subsequent encounter for closed fracture with routine healing: Secondary | ICD-10-CM | POA: Diagnosis not present

## 2022-08-02 DIAGNOSIS — M25661 Stiffness of right knee, not elsewhere classified: Secondary | ICD-10-CM | POA: Diagnosis not present

## 2022-08-02 DIAGNOSIS — M6281 Muscle weakness (generalized): Secondary | ICD-10-CM | POA: Diagnosis not present

## 2022-08-02 DIAGNOSIS — R262 Difficulty in walking, not elsewhere classified: Secondary | ICD-10-CM | POA: Diagnosis not present

## 2022-08-06 DIAGNOSIS — R262 Difficulty in walking, not elsewhere classified: Secondary | ICD-10-CM | POA: Diagnosis not present

## 2022-08-06 DIAGNOSIS — M25661 Stiffness of right knee, not elsewhere classified: Secondary | ICD-10-CM | POA: Diagnosis not present

## 2022-08-06 DIAGNOSIS — M6281 Muscle weakness (generalized): Secondary | ICD-10-CM | POA: Diagnosis not present

## 2022-08-06 DIAGNOSIS — S82001D Unspecified fracture of right patella, subsequent encounter for closed fracture with routine healing: Secondary | ICD-10-CM | POA: Diagnosis not present

## 2022-08-09 DIAGNOSIS — M6281 Muscle weakness (generalized): Secondary | ICD-10-CM | POA: Diagnosis not present

## 2022-08-09 DIAGNOSIS — R262 Difficulty in walking, not elsewhere classified: Secondary | ICD-10-CM | POA: Diagnosis not present

## 2022-08-09 DIAGNOSIS — M25661 Stiffness of right knee, not elsewhere classified: Secondary | ICD-10-CM | POA: Diagnosis not present

## 2022-08-09 DIAGNOSIS — S82001D Unspecified fracture of right patella, subsequent encounter for closed fracture with routine healing: Secondary | ICD-10-CM | POA: Diagnosis not present

## 2022-08-12 DIAGNOSIS — R262 Difficulty in walking, not elsewhere classified: Secondary | ICD-10-CM | POA: Diagnosis not present

## 2022-08-12 DIAGNOSIS — M6281 Muscle weakness (generalized): Secondary | ICD-10-CM | POA: Diagnosis not present

## 2022-08-12 DIAGNOSIS — M25661 Stiffness of right knee, not elsewhere classified: Secondary | ICD-10-CM | POA: Diagnosis not present

## 2022-08-12 DIAGNOSIS — S82001D Unspecified fracture of right patella, subsequent encounter for closed fracture with routine healing: Secondary | ICD-10-CM | POA: Diagnosis not present

## 2022-08-15 DIAGNOSIS — M25661 Stiffness of right knee, not elsewhere classified: Secondary | ICD-10-CM | POA: Diagnosis not present

## 2022-08-15 DIAGNOSIS — S82001D Unspecified fracture of right patella, subsequent encounter for closed fracture with routine healing: Secondary | ICD-10-CM | POA: Diagnosis not present

## 2022-08-15 DIAGNOSIS — M6281 Muscle weakness (generalized): Secondary | ICD-10-CM | POA: Diagnosis not present

## 2022-08-15 DIAGNOSIS — R262 Difficulty in walking, not elsewhere classified: Secondary | ICD-10-CM | POA: Diagnosis not present

## 2022-08-16 DIAGNOSIS — S82001D Unspecified fracture of right patella, subsequent encounter for closed fracture with routine healing: Secondary | ICD-10-CM | POA: Diagnosis not present

## 2022-08-19 DIAGNOSIS — S82001D Unspecified fracture of right patella, subsequent encounter for closed fracture with routine healing: Secondary | ICD-10-CM | POA: Diagnosis not present

## 2022-08-19 DIAGNOSIS — M25661 Stiffness of right knee, not elsewhere classified: Secondary | ICD-10-CM | POA: Diagnosis not present

## 2022-08-19 DIAGNOSIS — M6281 Muscle weakness (generalized): Secondary | ICD-10-CM | POA: Diagnosis not present

## 2022-08-19 DIAGNOSIS — M47817 Spondylosis without myelopathy or radiculopathy, lumbosacral region: Secondary | ICD-10-CM | POA: Diagnosis not present

## 2022-08-19 DIAGNOSIS — R262 Difficulty in walking, not elsewhere classified: Secondary | ICD-10-CM | POA: Diagnosis not present

## 2022-08-22 DIAGNOSIS — M6281 Muscle weakness (generalized): Secondary | ICD-10-CM | POA: Diagnosis not present

## 2022-08-22 DIAGNOSIS — M25661 Stiffness of right knee, not elsewhere classified: Secondary | ICD-10-CM | POA: Diagnosis not present

## 2022-08-22 DIAGNOSIS — R262 Difficulty in walking, not elsewhere classified: Secondary | ICD-10-CM | POA: Diagnosis not present

## 2022-08-22 DIAGNOSIS — S82001D Unspecified fracture of right patella, subsequent encounter for closed fracture with routine healing: Secondary | ICD-10-CM | POA: Diagnosis not present

## 2022-08-26 DIAGNOSIS — S82001D Unspecified fracture of right patella, subsequent encounter for closed fracture with routine healing: Secondary | ICD-10-CM | POA: Diagnosis not present

## 2022-08-26 DIAGNOSIS — Z1231 Encounter for screening mammogram for malignant neoplasm of breast: Secondary | ICD-10-CM | POA: Diagnosis not present

## 2022-08-26 DIAGNOSIS — M6281 Muscle weakness (generalized): Secondary | ICD-10-CM | POA: Diagnosis not present

## 2022-08-26 DIAGNOSIS — R262 Difficulty in walking, not elsewhere classified: Secondary | ICD-10-CM | POA: Diagnosis not present

## 2022-08-26 DIAGNOSIS — M25661 Stiffness of right knee, not elsewhere classified: Secondary | ICD-10-CM | POA: Diagnosis not present

## 2022-08-29 DIAGNOSIS — S82001D Unspecified fracture of right patella, subsequent encounter for closed fracture with routine healing: Secondary | ICD-10-CM | POA: Diagnosis not present

## 2022-08-29 DIAGNOSIS — M6281 Muscle weakness (generalized): Secondary | ICD-10-CM | POA: Diagnosis not present

## 2022-08-29 DIAGNOSIS — M25661 Stiffness of right knee, not elsewhere classified: Secondary | ICD-10-CM | POA: Diagnosis not present

## 2022-08-29 DIAGNOSIS — R262 Difficulty in walking, not elsewhere classified: Secondary | ICD-10-CM | POA: Diagnosis not present

## 2022-09-03 DIAGNOSIS — Z6826 Body mass index (BMI) 26.0-26.9, adult: Secondary | ICD-10-CM | POA: Diagnosis not present

## 2022-09-03 DIAGNOSIS — Z124 Encounter for screening for malignant neoplasm of cervix: Secondary | ICD-10-CM | POA: Diagnosis not present

## 2022-09-05 DIAGNOSIS — S82001D Unspecified fracture of right patella, subsequent encounter for closed fracture with routine healing: Secondary | ICD-10-CM | POA: Diagnosis not present

## 2022-09-05 DIAGNOSIS — R262 Difficulty in walking, not elsewhere classified: Secondary | ICD-10-CM | POA: Diagnosis not present

## 2022-09-05 DIAGNOSIS — M6281 Muscle weakness (generalized): Secondary | ICD-10-CM | POA: Diagnosis not present

## 2022-09-05 DIAGNOSIS — M25661 Stiffness of right knee, not elsewhere classified: Secondary | ICD-10-CM | POA: Diagnosis not present

## 2022-09-06 DIAGNOSIS — R194 Change in bowel habit: Secondary | ICD-10-CM | POA: Diagnosis not present

## 2022-09-10 DIAGNOSIS — M25661 Stiffness of right knee, not elsewhere classified: Secondary | ICD-10-CM | POA: Diagnosis not present

## 2022-09-10 DIAGNOSIS — S82001D Unspecified fracture of right patella, subsequent encounter for closed fracture with routine healing: Secondary | ICD-10-CM | POA: Diagnosis not present

## 2022-09-10 DIAGNOSIS — M6281 Muscle weakness (generalized): Secondary | ICD-10-CM | POA: Diagnosis not present

## 2022-09-10 DIAGNOSIS — R262 Difficulty in walking, not elsewhere classified: Secondary | ICD-10-CM | POA: Diagnosis not present

## 2022-09-17 DIAGNOSIS — M25661 Stiffness of right knee, not elsewhere classified: Secondary | ICD-10-CM | POA: Diagnosis not present

## 2022-09-17 DIAGNOSIS — S82001D Unspecified fracture of right patella, subsequent encounter for closed fracture with routine healing: Secondary | ICD-10-CM | POA: Diagnosis not present

## 2022-09-17 DIAGNOSIS — M6281 Muscle weakness (generalized): Secondary | ICD-10-CM | POA: Diagnosis not present

## 2022-09-17 DIAGNOSIS — R262 Difficulty in walking, not elsewhere classified: Secondary | ICD-10-CM | POA: Diagnosis not present

## 2022-09-20 DIAGNOSIS — M25561 Pain in right knee: Secondary | ICD-10-CM | POA: Diagnosis not present

## 2022-09-26 DIAGNOSIS — H6593 Unspecified nonsuppurative otitis media, bilateral: Secondary | ICD-10-CM | POA: Diagnosis not present

## 2022-09-26 DIAGNOSIS — I10 Essential (primary) hypertension: Secondary | ICD-10-CM | POA: Diagnosis not present

## 2022-09-26 DIAGNOSIS — R42 Dizziness and giddiness: Secondary | ICD-10-CM | POA: Diagnosis not present

## 2022-09-26 DIAGNOSIS — H938X2 Other specified disorders of left ear: Secondary | ICD-10-CM | POA: Diagnosis not present

## 2022-09-26 DIAGNOSIS — Z6825 Body mass index (BMI) 25.0-25.9, adult: Secondary | ICD-10-CM | POA: Diagnosis not present

## 2022-10-10 DIAGNOSIS — L821 Other seborrheic keratosis: Secondary | ICD-10-CM | POA: Diagnosis not present

## 2022-10-10 DIAGNOSIS — Z85828 Personal history of other malignant neoplasm of skin: Secondary | ICD-10-CM | POA: Diagnosis not present

## 2022-10-10 DIAGNOSIS — D225 Melanocytic nevi of trunk: Secondary | ICD-10-CM | POA: Diagnosis not present

## 2022-10-10 DIAGNOSIS — D2272 Melanocytic nevi of left lower limb, including hip: Secondary | ICD-10-CM | POA: Diagnosis not present

## 2022-10-10 DIAGNOSIS — L570 Actinic keratosis: Secondary | ICD-10-CM | POA: Diagnosis not present

## 2022-10-10 DIAGNOSIS — L578 Other skin changes due to chronic exposure to nonionizing radiation: Secondary | ICD-10-CM | POA: Diagnosis not present

## 2022-10-10 DIAGNOSIS — Z808 Family history of malignant neoplasm of other organs or systems: Secondary | ICD-10-CM | POA: Diagnosis not present

## 2022-11-21 DIAGNOSIS — M62838 Other muscle spasm: Secondary | ICD-10-CM | POA: Diagnosis not present

## 2022-12-31 LAB — AMB RESULTS CONSOLE CBG: Glucose: 90

## 2023-01-07 DIAGNOSIS — Z0000 Encounter for general adult medical examination without abnormal findings: Secondary | ICD-10-CM | POA: Diagnosis not present

## 2023-01-07 DIAGNOSIS — Z23 Encounter for immunization: Secondary | ICD-10-CM | POA: Diagnosis not present

## 2023-01-07 DIAGNOSIS — M47816 Spondylosis without myelopathy or radiculopathy, lumbar region: Secondary | ICD-10-CM | POA: Diagnosis not present

## 2023-01-07 DIAGNOSIS — J3089 Other allergic rhinitis: Secondary | ICD-10-CM | POA: Diagnosis not present

## 2023-01-07 DIAGNOSIS — M810 Age-related osteoporosis without current pathological fracture: Secondary | ICD-10-CM | POA: Diagnosis not present

## 2023-01-07 DIAGNOSIS — I10 Essential (primary) hypertension: Secondary | ICD-10-CM | POA: Diagnosis not present

## 2023-01-07 DIAGNOSIS — E7800 Pure hypercholesterolemia, unspecified: Secondary | ICD-10-CM | POA: Diagnosis not present

## 2023-01-07 DIAGNOSIS — J4520 Mild intermittent asthma, uncomplicated: Secondary | ICD-10-CM | POA: Diagnosis not present

## 2023-01-07 DIAGNOSIS — Z6825 Body mass index (BMI) 25.0-25.9, adult: Secondary | ICD-10-CM | POA: Diagnosis not present

## 2023-01-07 DIAGNOSIS — K219 Gastro-esophageal reflux disease without esophagitis: Secondary | ICD-10-CM | POA: Diagnosis not present

## 2023-02-12 DIAGNOSIS — M47817 Spondylosis without myelopathy or radiculopathy, lumbosacral region: Secondary | ICD-10-CM | POA: Diagnosis not present

## 2023-03-20 DIAGNOSIS — L821 Other seborrheic keratosis: Secondary | ICD-10-CM | POA: Diagnosis not present

## 2023-03-20 DIAGNOSIS — Z808 Family history of malignant neoplasm of other organs or systems: Secondary | ICD-10-CM | POA: Diagnosis not present

## 2023-03-20 DIAGNOSIS — D2272 Melanocytic nevi of left lower limb, including hip: Secondary | ICD-10-CM | POA: Diagnosis not present

## 2023-03-20 DIAGNOSIS — D225 Melanocytic nevi of trunk: Secondary | ICD-10-CM | POA: Diagnosis not present

## 2023-03-20 DIAGNOSIS — L578 Other skin changes due to chronic exposure to nonionizing radiation: Secondary | ICD-10-CM | POA: Diagnosis not present

## 2023-03-20 DIAGNOSIS — L570 Actinic keratosis: Secondary | ICD-10-CM | POA: Diagnosis not present

## 2023-03-20 DIAGNOSIS — Z85828 Personal history of other malignant neoplasm of skin: Secondary | ICD-10-CM | POA: Diagnosis not present

## 2023-05-05 DIAGNOSIS — H52203 Unspecified astigmatism, bilateral: Secondary | ICD-10-CM | POA: Diagnosis not present

## 2023-05-05 DIAGNOSIS — H5203 Hypermetropia, bilateral: Secondary | ICD-10-CM | POA: Diagnosis not present

## 2023-05-05 DIAGNOSIS — H31003 Unspecified chorioretinal scars, bilateral: Secondary | ICD-10-CM | POA: Diagnosis not present

## 2023-05-05 DIAGNOSIS — H2513 Age-related nuclear cataract, bilateral: Secondary | ICD-10-CM | POA: Diagnosis not present

## 2023-05-14 DIAGNOSIS — J300 Vasomotor rhinitis: Secondary | ICD-10-CM | POA: Diagnosis not present

## 2023-05-14 DIAGNOSIS — H1045 Other chronic allergic conjunctivitis: Secondary | ICD-10-CM | POA: Diagnosis not present

## 2023-05-14 DIAGNOSIS — J4530 Mild persistent asthma, uncomplicated: Secondary | ICD-10-CM | POA: Diagnosis not present

## 2023-06-03 DIAGNOSIS — Z23 Encounter for immunization: Secondary | ICD-10-CM | POA: Diagnosis not present

## 2023-06-19 DIAGNOSIS — M79675 Pain in left toe(s): Secondary | ICD-10-CM | POA: Diagnosis not present

## 2023-06-25 DIAGNOSIS — E7800 Pure hypercholesterolemia, unspecified: Secondary | ICD-10-CM | POA: Diagnosis not present

## 2023-06-25 DIAGNOSIS — L089 Local infection of the skin and subcutaneous tissue, unspecified: Secondary | ICD-10-CM | POA: Diagnosis not present

## 2023-06-25 DIAGNOSIS — K219 Gastro-esophageal reflux disease without esophagitis: Secondary | ICD-10-CM | POA: Diagnosis not present

## 2023-06-25 DIAGNOSIS — I10 Essential (primary) hypertension: Secondary | ICD-10-CM | POA: Diagnosis not present

## 2023-08-07 DIAGNOSIS — M47817 Spondylosis without myelopathy or radiculopathy, lumbosacral region: Secondary | ICD-10-CM | POA: Diagnosis not present

## 2023-08-18 DIAGNOSIS — M25375 Other instability, left foot: Secondary | ICD-10-CM | POA: Diagnosis not present

## 2023-08-18 DIAGNOSIS — L84 Corns and callosities: Secondary | ICD-10-CM | POA: Diagnosis not present

## 2023-08-18 DIAGNOSIS — M79672 Pain in left foot: Secondary | ICD-10-CM | POA: Diagnosis not present

## 2023-09-01 DIAGNOSIS — Z1231 Encounter for screening mammogram for malignant neoplasm of breast: Secondary | ICD-10-CM | POA: Diagnosis not present

## 2023-09-16 DIAGNOSIS — R208 Other disturbances of skin sensation: Secondary | ICD-10-CM | POA: Diagnosis not present

## 2023-10-02 DIAGNOSIS — M810 Age-related osteoporosis without current pathological fracture: Secondary | ICD-10-CM | POA: Diagnosis not present

## 2023-10-02 DIAGNOSIS — Z01419 Encounter for gynecological examination (general) (routine) without abnormal findings: Secondary | ICD-10-CM | POA: Diagnosis not present

## 2023-10-02 DIAGNOSIS — Z6826 Body mass index (BMI) 26.0-26.9, adult: Secondary | ICD-10-CM | POA: Diagnosis not present

## 2023-10-02 DIAGNOSIS — R8290 Unspecified abnormal findings in urine: Secondary | ICD-10-CM | POA: Diagnosis not present

## 2023-10-23 DIAGNOSIS — D485 Neoplasm of uncertain behavior of skin: Secondary | ICD-10-CM | POA: Diagnosis not present

## 2023-10-23 DIAGNOSIS — C44221 Squamous cell carcinoma of skin of unspecified ear and external auricular canal: Secondary | ICD-10-CM | POA: Diagnosis not present

## 2023-11-04 DIAGNOSIS — C44229 Squamous cell carcinoma of skin of left ear and external auricular canal: Secondary | ICD-10-CM | POA: Diagnosis not present

## 2023-12-02 DIAGNOSIS — Z5189 Encounter for other specified aftercare: Secondary | ICD-10-CM | POA: Diagnosis not present

## 2023-12-25 DIAGNOSIS — I10 Essential (primary) hypertension: Secondary | ICD-10-CM | POA: Diagnosis not present

## 2023-12-25 DIAGNOSIS — R29898 Other symptoms and signs involving the musculoskeletal system: Secondary | ICD-10-CM | POA: Diagnosis not present

## 2023-12-25 DIAGNOSIS — E7800 Pure hypercholesterolemia, unspecified: Secondary | ICD-10-CM | POA: Diagnosis not present

## 2024-01-28 DIAGNOSIS — H01112 Allergic dermatitis of right lower eyelid: Secondary | ICD-10-CM | POA: Diagnosis not present

## 2024-01-28 DIAGNOSIS — H01115 Allergic dermatitis of left lower eyelid: Secondary | ICD-10-CM | POA: Diagnosis not present

## 2024-02-05 DIAGNOSIS — M47817 Spondylosis without myelopathy or radiculopathy, lumbosacral region: Secondary | ICD-10-CM | POA: Diagnosis not present

## 2024-03-24 DIAGNOSIS — D2272 Melanocytic nevi of left lower limb, including hip: Secondary | ICD-10-CM | POA: Diagnosis not present

## 2024-03-24 DIAGNOSIS — L578 Other skin changes due to chronic exposure to nonionizing radiation: Secondary | ICD-10-CM | POA: Diagnosis not present

## 2024-03-24 DIAGNOSIS — D485 Neoplasm of uncertain behavior of skin: Secondary | ICD-10-CM | POA: Diagnosis not present

## 2024-03-24 DIAGNOSIS — H019 Unspecified inflammation of eyelid: Secondary | ICD-10-CM | POA: Diagnosis not present

## 2024-03-24 DIAGNOSIS — D225 Melanocytic nevi of trunk: Secondary | ICD-10-CM | POA: Diagnosis not present

## 2024-03-24 DIAGNOSIS — Z808 Family history of malignant neoplasm of other organs or systems: Secondary | ICD-10-CM | POA: Diagnosis not present

## 2024-03-24 DIAGNOSIS — L910 Hypertrophic scar: Secondary | ICD-10-CM | POA: Diagnosis not present

## 2024-03-24 DIAGNOSIS — L570 Actinic keratosis: Secondary | ICD-10-CM | POA: Diagnosis not present

## 2024-03-24 DIAGNOSIS — L821 Other seborrheic keratosis: Secondary | ICD-10-CM | POA: Diagnosis not present

## 2024-03-24 DIAGNOSIS — L820 Inflamed seborrheic keratosis: Secondary | ICD-10-CM | POA: Diagnosis not present

## 2024-03-24 DIAGNOSIS — Z85828 Personal history of other malignant neoplasm of skin: Secondary | ICD-10-CM | POA: Diagnosis not present

## 2024-04-22 DIAGNOSIS — R42 Dizziness and giddiness: Secondary | ICD-10-CM | POA: Diagnosis not present

## 2024-04-22 DIAGNOSIS — H539 Unspecified visual disturbance: Secondary | ICD-10-CM | POA: Diagnosis not present

## 2024-04-23 DIAGNOSIS — J3089 Other allergic rhinitis: Secondary | ICD-10-CM | POA: Diagnosis not present

## 2024-04-23 DIAGNOSIS — I10 Essential (primary) hypertension: Secondary | ICD-10-CM | POA: Diagnosis not present

## 2024-05-06 DIAGNOSIS — H25813 Combined forms of age-related cataract, bilateral: Secondary | ICD-10-CM | POA: Diagnosis not present

## 2024-05-06 DIAGNOSIS — H52203 Unspecified astigmatism, bilateral: Secondary | ICD-10-CM | POA: Diagnosis not present

## 2024-05-14 DIAGNOSIS — J300 Vasomotor rhinitis: Secondary | ICD-10-CM | POA: Diagnosis not present

## 2024-05-14 DIAGNOSIS — H1045 Other chronic allergic conjunctivitis: Secondary | ICD-10-CM | POA: Diagnosis not present

## 2024-05-14 DIAGNOSIS — J4530 Mild persistent asthma, uncomplicated: Secondary | ICD-10-CM | POA: Diagnosis not present

## 2024-05-26 DIAGNOSIS — Z23 Encounter for immunization: Secondary | ICD-10-CM | POA: Diagnosis not present

## 2024-06-30 DIAGNOSIS — I10 Essential (primary) hypertension: Secondary | ICD-10-CM | POA: Diagnosis not present

## 2024-06-30 DIAGNOSIS — E7800 Pure hypercholesterolemia, unspecified: Secondary | ICD-10-CM | POA: Diagnosis not present
